# Patient Record
Sex: Male | Born: 1995
Health system: Southern US, Community
[De-identification: ages and names within clinical notes are randomized; demographics above are authoritative.]

## PROBLEM LIST (undated history)

## (undated) DIAGNOSIS — J45909 Unspecified asthma, uncomplicated: Secondary | ICD-10-CM

## (undated) HISTORY — PX: TYMPANOSTOMY TUBE PLACEMENT: SHX32

## (undated) HISTORY — PX: TONSILLECTOMY: SUR1361

---
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
## 2017-05-24 IMAGING — CT CT ABD-PELV W/ CM
2 of 4 series · 16 of 46 positions shown, 18 images · IV contrast (APPLIED)
Comparison: None.

CLINICAL DATA: Nausea, vomiting, diarrhea. Chronic abdominal pain.
Worsening symptoms today.

EXAM:
CT ABDOMEN AND PELVIS WITH CONTRAST
TECHNIQUE: Multidetector CT imaging of the abdomen and pelvis was performed
using the standard protocol following bolus administration of
intravenous contrast.
CONTRAST:  100mL OMNIPAQUE IOHEXOL 300 MG/ML  SOLN

[Series 2: abd/ pelvis 5.0 i30f 1 · axial · 0.69mm/px · z∈[-715,-300]mm · 13 of 91 slices shown, 15 images]
[im 4/91  soft-tissue]
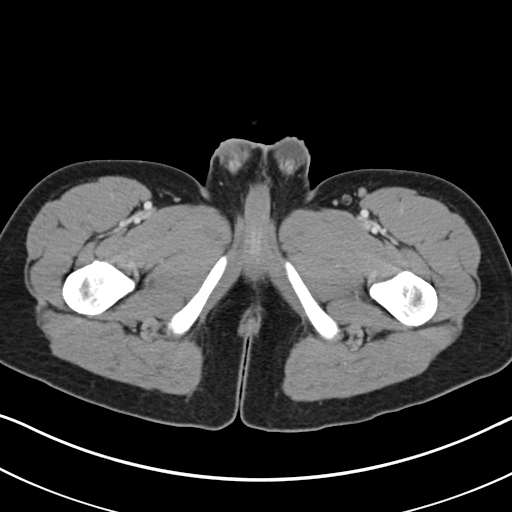
[im 4/91  bone]
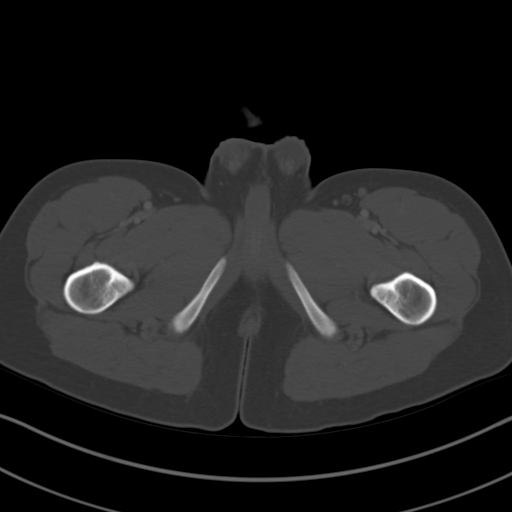
[im 12/91  soft-tissue]
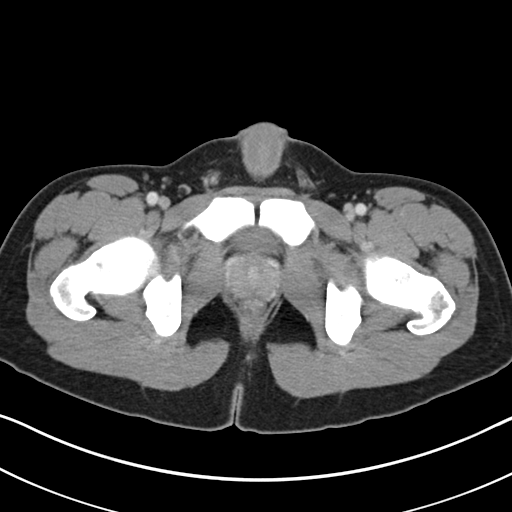
[im 19/91  soft-tissue]
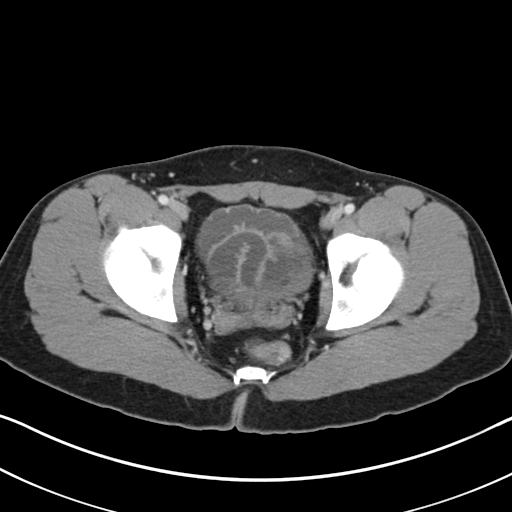
[im 27/91  soft-tissue]
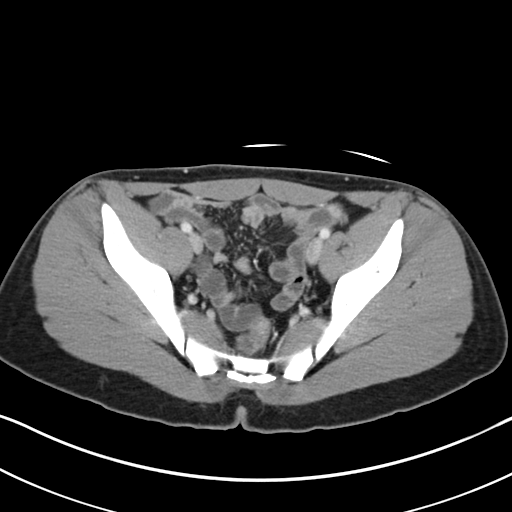
[im 31/91  soft-tissue]
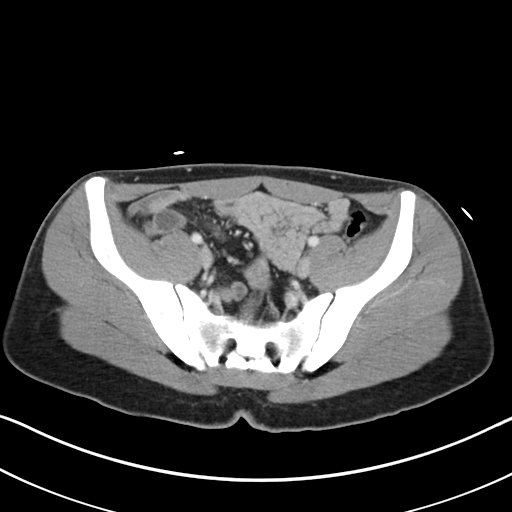
[im 38/91  soft-tissue]
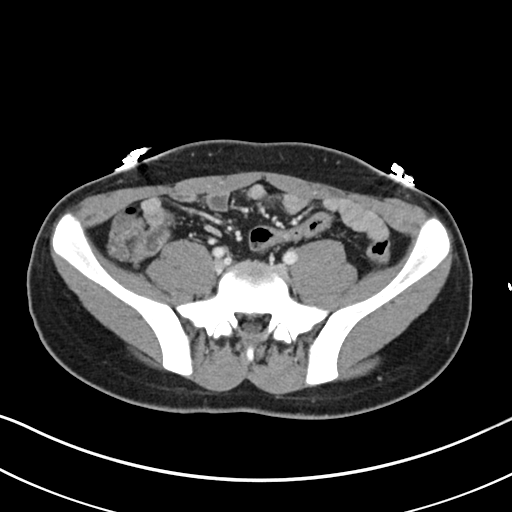
[im 46/91  soft-tissue]
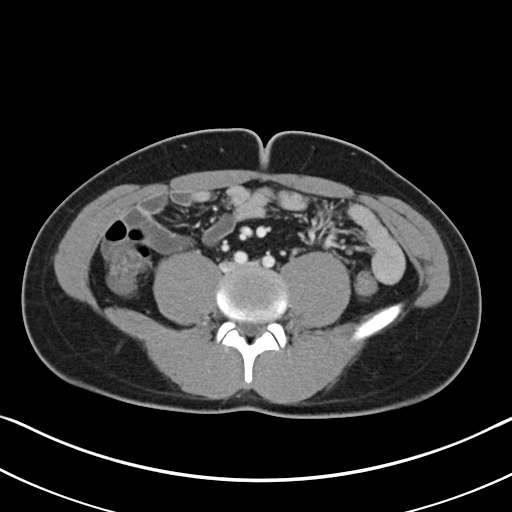
[im 53/91  soft-tissue]
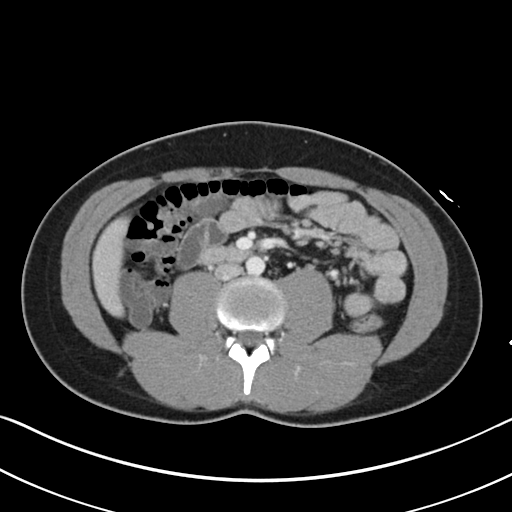
[im 61/91  soft-tissue]
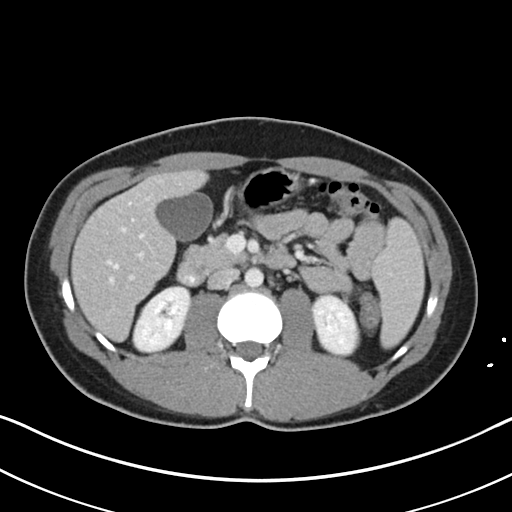
[im 61/91  bone]
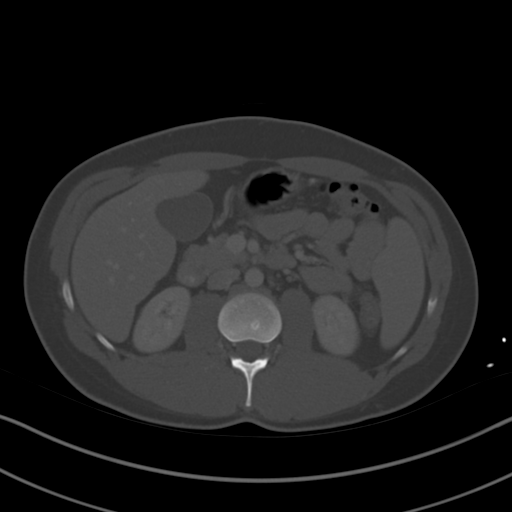
[im 64/91  soft-tissue]
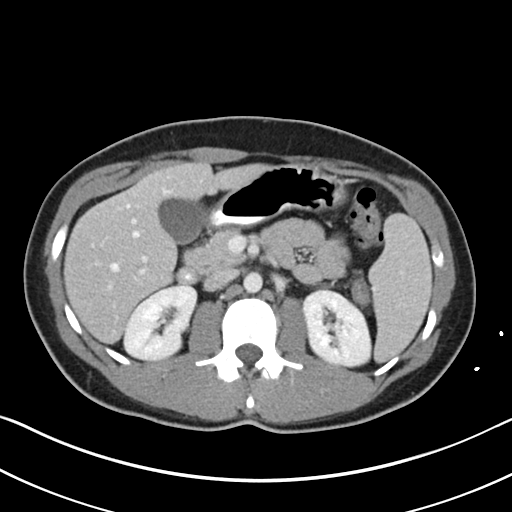
[im 72/91  soft-tissue]
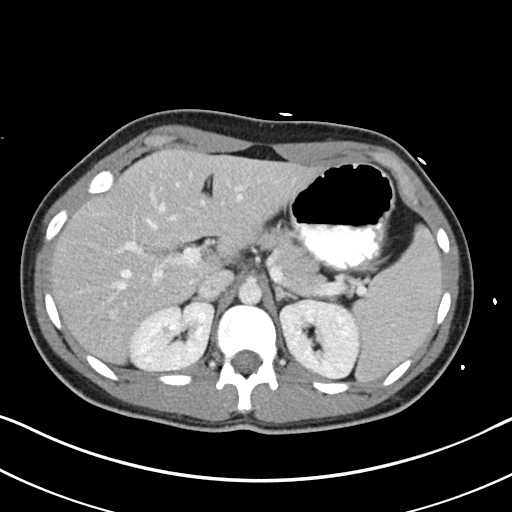
[im 79/91  soft-tissue]
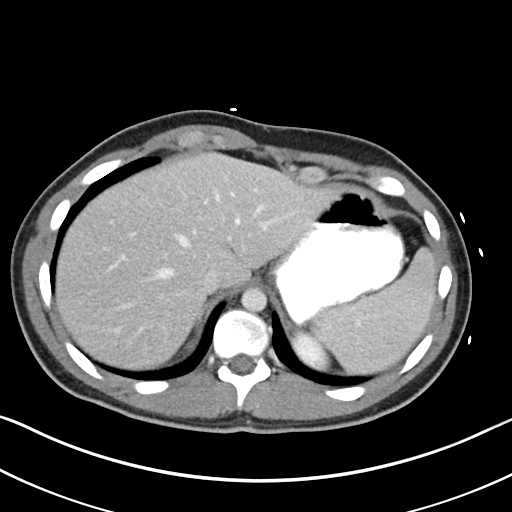
[im 87/91  soft-tissue]
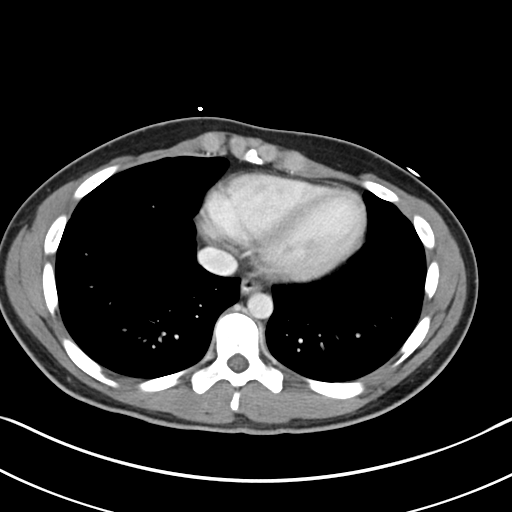

[Series 5: coronal soft tissue · coronal · 0.75mm/px · 3 of 73 slices shown]
[im 25/73  soft-tissue]
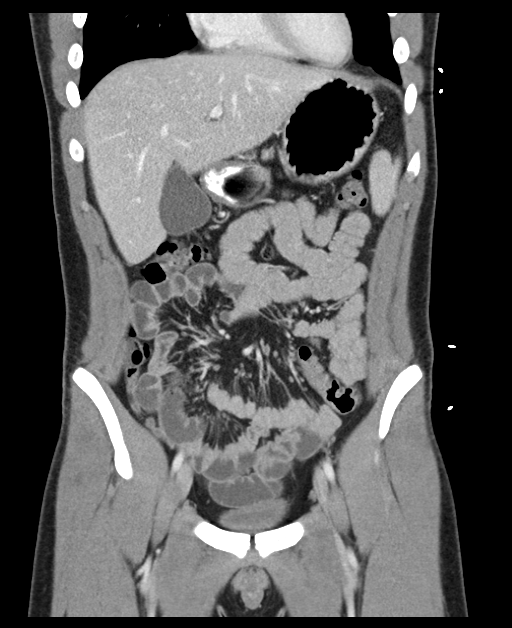
[im 33/73  soft-tissue]
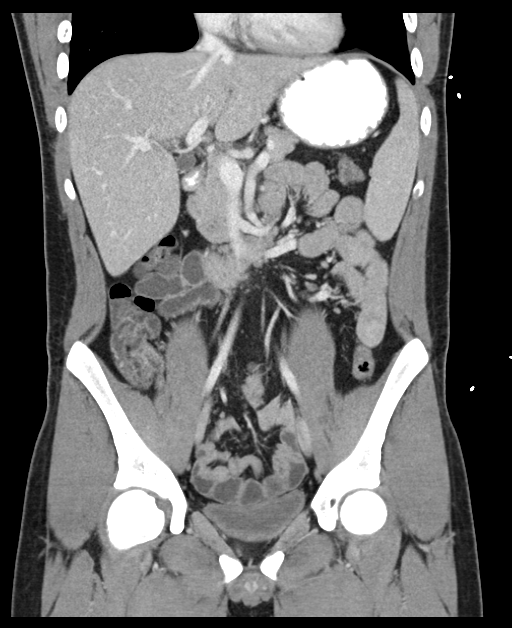
[im 41/73  soft-tissue]
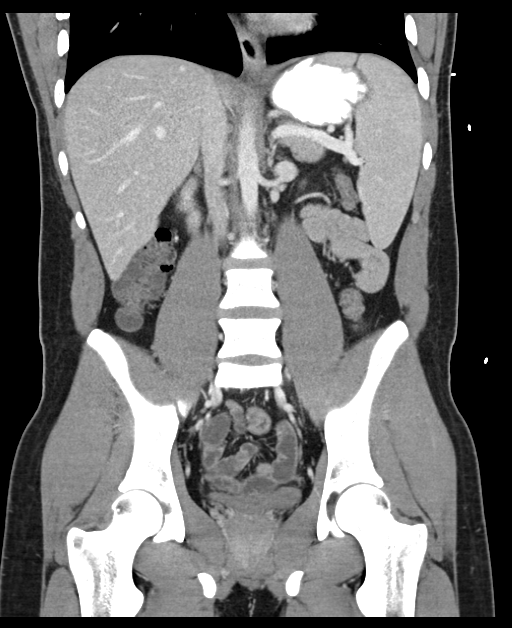

[16 of 46 positions shown; findings below may reference images not displayed]

FINDINGS: Musculoskeletal:  Normal.

Lung Bases: Clear.

Liver:  Normal.

Spleen: Splenomegaly is present. Splenic volume is 840 mL. Splenic
dimensions are 12 cm x 14 cm x 10 cm.

Gallbladder:  Normal.

Common bile duct:  Normal.

Pancreas:  Normal.

Adrenal glands:  Normal.

Kidneys: Normal enhancement. No calculi. No hydronephrosis. LEFT
ureter normal. RIGHT ureter normal.

Stomach:  Normal.

Small bowel: No small bowel obstruction. Portions of small bowel are
collapsed.

Colon: Normal appendix. Nonspecific fluid levels are present within
the colon. These are present in the proximal colon and in the
rectosigmoid.

Pelvic Genitourinary:  Normal.  Urinary bladder collapsed.

Peritoneum: No free air.  No free fluid.

Vascular/lymphatic: Normal.

Body Wall: Normal.
IMPRESSION: 1. Nonspecific colonic air-fluid levels, most commonly associated
with enteric infection.
2. Splenomegaly.

## 2019-07-18 ENCOUNTER — Other Ambulatory Visit: Payer: Self-pay

## 2019-07-18 ENCOUNTER — Ambulatory Visit: Payer: Self-pay

## 2019-07-18 DIAGNOSIS — Z0289 Encounter for other administrative examinations: Secondary | ICD-10-CM

## 2019-07-18 LAB — POCT URINALYSIS DIPSTICK
Bilirubin, UA: NEGATIVE
Blood, UA: NEGATIVE
Glucose, UA: NEGATIVE
Ketones, UA: NEGATIVE
Leukocytes, UA: NEGATIVE
Nitrite, UA: NEGATIVE
Protein, UA: POSITIVE — AB
Spec Grav, UA: >=1.03 — AB (ref 1.010–1.025)
Urobilinogen, UA: 0.2 E.U./dL
pH, UA: 6 (ref 5.0–8.0)

## 2019-07-19 LAB — CMP12+LP+TP+TSH+6AC+CBC/D/PLT
ALT: 31 IU/L (ref 0–44)
AST: 19 IU/L (ref 0–40)
Albumin/Globulin Ratio: 2.1 (ref 1.2–2.2)
Albumin: 4.6 g/dL (ref 4.1–5.2)
Alkaline Phosphatase: 46 IU/L (ref 39–117)
BUN/Creatinine Ratio: 21 — ABNORMAL HIGH (ref 9–20)
BUN: 19 mg/dL (ref 6–20)
Basophils Absolute: 0 10*3/uL (ref 0.0–0.2)
Basos: 0 %
Bilirubin Total: 0.6 mg/dL (ref 0.0–1.2)
Calcium: 9.5 mg/dL (ref 8.7–10.2)
Chloride: 106 mmol/L (ref 96–106)
Chol/HDL Ratio: 5.2 ratio — ABNORMAL HIGH (ref 0.0–5.0)
Cholesterol, Total: 198 mg/dL (ref 100–199)
Creatinine, Ser: 0.91 mg/dL (ref 0.76–1.27)
EOS (ABSOLUTE): 0.1 10*3/uL (ref 0.0–0.4)
Eos: 2 %
Estimated CHD Risk: 1.1 times avg. — ABNORMAL HIGH (ref 0.0–1.0)
Free Thyroxine Index: 2.4 (ref 1.2–4.9)
GFR calc Af Amer: 139 mL/min/{1.73_m2} (ref >59)
GFR calc non Af Amer: 120 mL/min/{1.73_m2} (ref >59)
GGT: 21 IU/L (ref 0–65)
Globulin, Total: 2.2 g/dL (ref 1.5–4.5)
Glucose: 90 mg/dL (ref 65–99)
HDL: 38 mg/dL — ABNORMAL LOW (ref >39)
Hematocrit: 44.8 % (ref 37.5–51.0)
Hemoglobin: 15.2 g/dL (ref 13.0–17.7)
Immature Grans (Abs): 0 10*3/uL (ref 0.0–0.1)
Immature Granulocytes: 0 %
Iron: 106 ug/dL (ref 38–169)
LDH: 140 IU/L (ref 121–224)
LDL Chol Calc (NIH): 138 mg/dL — ABNORMAL HIGH (ref 0–99)
Lymphocytes Absolute: 2 10*3/uL (ref 0.7–3.1)
Lymphs: 37 %
MCH: 29.7 pg (ref 26.6–33.0)
MCHC: 33.9 g/dL (ref 31.5–35.7)
MCV: 88 fL (ref 79–97)
Monocytes Absolute: 0.4 10*3/uL (ref 0.1–0.9)
Monocytes: 8 %
Neutrophils Absolute: 2.8 10*3/uL (ref 1.4–7.0)
Neutrophils: 53 %
Phosphorus: 3.7 mg/dL (ref 2.8–4.1)
Platelets: 254 10*3/uL (ref 150–450)
Potassium: 4.5 mmol/L (ref 3.5–5.2)
RBC: 5.11 x10E6/uL (ref 4.14–5.80)
RDW: 11.9 % (ref 11.6–15.4)
Sodium: 140 mmol/L (ref 134–144)
T3 Uptake Ratio: 28 % (ref 24–39)
T4, Total: 8.5 ug/dL (ref 4.5–12.0)
TSH: 1.26 u[IU]/mL (ref 0.450–4.500)
Total Protein: 6.8 g/dL (ref 6.0–8.5)
Triglycerides: 121 mg/dL (ref 0–149)
Uric Acid: 7.2 mg/dL (ref 3.7–8.6)
VLDL Cholesterol Cal: 22 mg/dL (ref 5–40)
WBC: 5.3 10*3/uL (ref 3.4–10.8)

## 2019-08-29 ENCOUNTER — Other Ambulatory Visit: Payer: Self-pay

## 2019-08-29 ENCOUNTER — Ambulatory Visit: Payer: 59 | Admitting: Occupational Medicine

## 2019-08-29 ENCOUNTER — Encounter: Payer: Self-pay | Admitting: Occupational Medicine

## 2019-08-29 VITALS — BP 132/82 | HR 101 | Temp 98.6°F | Resp 12 | Ht 67.0 in | Wt 215.0 lb

## 2019-08-29 DIAGNOSIS — Z0000 Encounter for general adult medical examination without abnormal findings: Secondary | ICD-10-CM

## 2019-11-01 DIAGNOSIS — Z03818 Encounter for observation for suspected exposure to other biological agents ruled out: Secondary | ICD-10-CM | POA: Diagnosis not present

## 2019-11-01 DIAGNOSIS — J069 Acute upper respiratory infection, unspecified: Secondary | ICD-10-CM | POA: Diagnosis not present

## 2019-11-01 DIAGNOSIS — J029 Acute pharyngitis, unspecified: Secondary | ICD-10-CM | POA: Diagnosis not present

## 2020-01-01 ENCOUNTER — Other Ambulatory Visit: Payer: Self-pay

## 2020-01-01 ENCOUNTER — Ambulatory Visit: Payer: Self-pay

## 2020-01-01 DIAGNOSIS — Z0000 Encounter for general adult medical examination without abnormal findings: Secondary | ICD-10-CM

## 2020-01-01 LAB — POCT URINALYSIS DIPSTICK
Bilirubin, UA: NEGATIVE
Blood, UA: NEGATIVE
Glucose, UA: NEGATIVE
Ketones, UA: NEGATIVE
Leukocytes, UA: NEGATIVE
Nitrite, UA: NEGATIVE
Protein, UA: NEGATIVE
Spec Grav, UA: >=1.03 — AB (ref 1.010–1.025)
Urobilinogen, UA: 0.2 E.U./dL
pH, UA: 5.5 (ref 5.0–8.0)

## 2020-01-01 NOTE — Progress Notes (Signed)
Scheduled to complete physical 01/10/20 with Albina Billet, NP-C (Interim Provider).  AMD

## 2020-01-04 LAB — CMP12+LP+TP+TSH+6AC+CBC/D/PLT
ALT: 27 IU/L (ref 0–44)
AST: 18 IU/L (ref 0–40)
Albumin/Globulin Ratio: 2.6 — ABNORMAL HIGH (ref 1.2–2.2)
Albumin: 4.9 g/dL (ref 4.1–5.2)
Alkaline Phosphatase: 49 IU/L (ref 39–117)
BUN/Creatinine Ratio: 16 (ref 9–20)
BUN: 15 mg/dL (ref 6–20)
Basophils Absolute: 0 10*3/uL (ref 0.0–0.2)
Basos: 1 %
Bilirubin Total: 0.4 mg/dL (ref 0.0–1.2)
Calcium: 9.7 mg/dL (ref 8.7–10.2)
Chloride: 104 mmol/L (ref 96–106)
Chol/HDL Ratio: 5.5 ratio — ABNORMAL HIGH (ref 0.0–5.0)
Cholesterol, Total: 181 mg/dL (ref 100–199)
Creatinine, Ser: 0.91 mg/dL (ref 0.76–1.27)
EOS (ABSOLUTE): 0.1 10*3/uL (ref 0.0–0.4)
Eos: 2 %
Estimated CHD Risk: 1.2 times avg. — ABNORMAL HIGH (ref 0.0–1.0)
Free Thyroxine Index: 2.5 (ref 1.2–4.9)
GFR calc Af Amer: 138 mL/min/{1.73_m2} (ref >59)
GFR calc non Af Amer: 119 mL/min/{1.73_m2} (ref >59)
GGT: 19 IU/L (ref 0–65)
Globulin, Total: 1.9 g/dL (ref 1.5–4.5)
Glucose: 95 mg/dL (ref 65–99)
HDL: 33 mg/dL — ABNORMAL LOW (ref >39)
Hematocrit: 42.5 % (ref 37.5–51.0)
Hemoglobin: 14.9 g/dL (ref 13.0–17.7)
Immature Grans (Abs): 0 10*3/uL (ref 0.0–0.1)
Immature Granulocytes: 0 %
Iron: 66 ug/dL (ref 38–169)
LDH: 140 IU/L (ref 121–224)
LDL Chol Calc (NIH): 125 mg/dL — ABNORMAL HIGH (ref 0–99)
Lymphocytes Absolute: 1.9 10*3/uL (ref 0.7–3.1)
Lymphs: 40 %
MCH: 31.6 pg (ref 26.6–33.0)
MCHC: 35.1 g/dL (ref 31.5–35.7)
MCV: 90 fL (ref 79–97)
Monocytes Absolute: 0.4 10*3/uL (ref 0.1–0.9)
Monocytes: 9 %
Neutrophils Absolute: 2.3 10*3/uL (ref 1.4–7.0)
Neutrophils: 48 %
Phosphorus: 3.3 mg/dL (ref 2.8–4.1)
Platelets: 238 10*3/uL (ref 150–450)
Potassium: 4.5 mmol/L (ref 3.5–5.2)
RBC: 4.71 x10E6/uL (ref 4.14–5.80)
RDW: 12.2 % (ref 11.6–15.4)
Sodium: 140 mmol/L (ref 134–144)
T3 Uptake Ratio: 31 % (ref 24–39)
T4, Total: 8 ug/dL (ref 4.5–12.0)
TSH: 0.427 u[IU]/mL — ABNORMAL LOW (ref 0.450–4.500)
Total Protein: 6.8 g/dL (ref 6.0–8.5)
Triglycerides: 125 mg/dL (ref 0–149)
Uric Acid: 7.4 mg/dL (ref 3.8–8.4)
VLDL Cholesterol Cal: 23 mg/dL (ref 5–40)
WBC: 4.7 10*3/uL (ref 3.4–10.8)

## 2020-01-04 LAB — QUANTIFERON-TB GOLD PLUS
QuantiFERON Mitogen Value: >10 IU/mL
QuantiFERON Nil Value: 0.03 IU/mL
QuantiFERON TB1 Ag Value: 0.07 IU/mL
QuantiFERON TB2 Ag Value: 0.08 IU/mL
QuantiFERON-TB Gold Plus: NEGATIVE

## 2020-01-12 ENCOUNTER — Telehealth: Payer: Self-pay

## 2020-01-12 NOTE — Telephone Encounter (Signed)
No vm set up was unable to lmom to confirm for 01-16-20 ov.

## 2020-01-16 ENCOUNTER — Ambulatory Visit: Payer: 59 | Admitting: Nurse Practitioner

## 2020-01-16 ENCOUNTER — Encounter: Payer: Self-pay | Admitting: Emergency Medicine

## 2020-01-16 ENCOUNTER — Other Ambulatory Visit: Payer: Self-pay

## 2020-01-16 ENCOUNTER — Encounter: Payer: Self-pay | Admitting: Nurse Practitioner

## 2020-01-16 ENCOUNTER — Ambulatory Visit: Payer: Self-pay | Admitting: Emergency Medicine

## 2020-01-16 VITALS — BP 134/84 | HR 94 | Temp 98.1°F | Resp 12 | Ht 67.0 in | Wt 207.0 lb

## 2020-01-16 VITALS — BP 128/90 | HR 77 | Temp 97.6°F | Resp 16 | Ht 67.0 in | Wt 208.0 lb

## 2020-01-16 DIAGNOSIS — K529 Noninfective gastroenteritis and colitis, unspecified: Secondary | ICD-10-CM | POA: Diagnosis not present

## 2020-01-16 DIAGNOSIS — K580 Irritable bowel syndrome with diarrhea: Secondary | ICD-10-CM | POA: Diagnosis not present

## 2020-01-16 DIAGNOSIS — K2980 Duodenitis without bleeding: Secondary | ICD-10-CM

## 2020-01-16 DIAGNOSIS — Z0000 Encounter for general adult medical examination without abnormal findings: Secondary | ICD-10-CM

## 2020-01-16 MED ORDER — DICYCLOMINE HCL 10 MG PO CAPS: 10.0000 mg | ORAL_CAPSULE | Freq: Three times a day (TID) | ORAL | 3 refills | Status: DC | PRN

## 2020-01-16 NOTE — Progress Notes (Signed)
Labs completed 01/01/20. EKG completed today.  AMD

## 2020-01-16 NOTE — Progress Notes (Signed)
[facility] [address] [place], [place] [address]  Internal MEDICINE  Office Visit Note  Patient Name: Jack Garza  [id number]  [id number]  Date of Service: 01/16/2020   Complaints/HPI Pt is here for establishment of PCP. Chief Complaint  Patient presents with  . New Patient (Initial Visit)   The patient is here to reestablish primary care provider. He has recently restarted having nausea and vomiting about a month ago. He consistently has abdominal cramping with diarrhea. This has been, pretty much, ongoing since he was a patient here severl years ago. He does have significant food allergies. He saw GI specialist. Had a colonoscopy and endoscopy done. He did have gastritis and enteritis due to chronic inflammation of the stomach and intestinal organs. He really would like to have a new referral to see new GI provider. His mother prefers for him to see Dr. Tobi Bastos to verify food allergies and possible celiac disease causing the problems or if there is something else going on. He states that nausea and vomiting occurs every few weeks. The intestinal cramping and diarrhea are nearly every day.  Will take antidiarrheal medication sometimes which helps for short period.    Current Medication: Outpatient Encounter Medications as of 01/16/2020  Medication Sig  . dicyclomine (BENTYL) 10 MG capsule Take 1 capsule (10 mg total) by mouth 3 (three) times daily as needed for spasms.   No facility-administered encounter medications on file as of 01/16/2020.    Surgical History: Past Surgical History:  Procedure Laterality Date  . COLONOSCOPY  2016  . TONSILLECTOMY    . TYMPANOSTOMY TUBE PLACEMENT      Medical History: Past Medical History:  Diagnosis Date  . Asthma     Family History: Family History  Problem Relation Age of Onset  . Hypertension Mother   . Hypertension Father   . Diabetes Father     Social History   Socioeconomic History  . Marital  status: Single    Spouse name: Not on file  . Number of children: Not on file  . Years of education: Not on file  . Highest education level: Not on file  Occupational History  . Not on file  Tobacco Use  . Smoking status: Never Smoker  . Smokeless tobacco: Never Used  Substance and Sexual Activity  . Alcohol use: Yes    Comment: ocassionally   . Drug use: No  . Sexual activity: Not on file  Other Topics Concern  . Not on file  Social History Narrative  . Not on file   Social Determinants of Health   Financial Resource Strain:   . Difficulty of Paying Living Expenses:   Food Insecurity:   . Worried About Programme researcher, broadcasting/film/video in the Last Year:   . Barista in the Last Year:   Transportation Needs:   . Freight forwarder (Medical):   Marland Kitchen Lack of Transportation (Non-Medical):   Physical Activity:   . Days of Exercise per Week:   . Minutes of Exercise per Session:   Stress:   . Feeling of Stress :   Social Connections:   . Frequency of Communication with Friends and Family:   . Frequency of Social Gatherings with Friends and Family:   . Attends Religious Services:   . Active Member of Clubs or Organizations:   . Attends Banker Meetings:   Marland Kitchen Marital Status:   Intimate Partner Violence:   . Fear of Current or  Ex-Partner:   . Emotionally Abused:   Marland Kitchen Physically Abused:   . Sexually Abused:      Review of Systems  Constitutional: Negative for activity change, chills, fatigue and unexpected weight change.  HENT: Negative for congestion, postnasal drip, rhinorrhea, sneezing and sore throat.   Respiratory: Negative for cough, chest tightness, shortness of breath and wheezing.   Cardiovascular: Negative for chest pain and palpitations.  Gastrointestinal: Positive for diarrhea and nausea. Negative for abdominal pain, constipation and vomiting.       Intestinal cramping.   Endocrine: Negative for cold intolerance, heat intolerance, polydipsia and  polyuria.  Musculoskeletal: Negative for arthralgias, back pain, joint swelling and neck pain.  Skin: Negative for rash.  Allergic/Immunologic: Negative for environmental allergies.  Neurological: Negative for dizziness, tremors, numbness and headaches.  Hematological: Negative for adenopathy. Does not bruise/bleed easily.  Psychiatric/Behavioral: Negative for behavioral problems (Depression), sleep disturbance and suicidal ideas. The patient is not nervous/anxious.    Today's Vitals   01/16/20 0857  BP: 128/90  Pulse: 77  Resp: 16  Temp: 97.6 F (36.4 C)  SpO2: 98%  Weight: 208 lb (94.3 kg)  Height: 5\' 7"  (1.702 m)   Body mass index is 32.58 kg/m.  Physical Exam Vitals and nursing note reviewed.  Constitutional:      General: He is not in acute distress.    Appearance: Normal appearance. He is well-developed. He is not diaphoretic.  HENT:     Head: Normocephalic and atraumatic.     Mouth/Throat:     Pharynx: No oropharyngeal exudate.  Eyes:     Pupils: Pupils are equal, round, and reactive to light.  Neck:     Thyroid: No thyromegaly.     Vascular: No JVD.     Trachea: No tracheal deviation.  Cardiovascular:     Rate and Rhythm: Normal rate and regular rhythm.     Heart sounds: Normal heart sounds. No murmur. No friction rub. No gallop.   Pulmonary:     Effort: Pulmonary effort is normal. No respiratory distress.     Breath sounds: Normal breath sounds. No wheezing or rales.  Chest:     Chest wall: No tenderness.  Abdominal:     General: Bowel sounds are normal.     Palpations: Abdomen is soft.     Comments: Mild, generalized abdominal tenderness   Musculoskeletal:        General: Normal range of motion.     Cervical back: Normal range of motion and neck supple.  Lymphadenopathy:     Cervical: No cervical adenopathy.  Skin:    General: Skin is warm and dry.  Neurological:     Mental Status: He is alert and oriented to person, place, and time.     Cranial  Nerves: No cranial nerve deficit.  Psychiatric:        Behavior: Behavior normal.        Thought Content: Thought content normal.        Judgment: Judgment normal.    Assessment/Plan: 1. Irritable bowel syndrome with diarrhea Start dicyclomine 10mg  up to three times daily as needed for intestinal cramping and diarrhea. New referral to GI for further evaluation.  - dicyclomine (BENTYL) 10 MG capsule; Take 1 capsule (10 mg total) by mouth 3 (three) times daily as needed for spasms.  Dispense: 90 capsule; Refill: 3 - Ambulatory referral to Gastroenterology  2. Chronic diarrhea Start dicyclomine 10mg  up to three times daily as needed for intestinal cramping and diarrhea.  New referral to GI for further evaluation.  - Ambulatory referral to Gastroenterology  3. Duodenitis New referral to GI for further evaluation.  - Ambulatory referral to Gastroenterology  General Counseling: Andee Lineman understanding of the findings of todays visit and agrees with plan of treatment. I have discussed any further diagnostic evaluation that may be needed or ordered today. We also reviewed his medications today. he has been encouraged to call the office with any questions or concerns that should arise related to todays visit.    Counseling:  This patient was seen by Vincent Gros FNP Collaboration with Dr Lyndon Code as a part of collaborative care agreement  Orders Placed This Encounter  Procedures  . Ambulatory referral to Gastroenterology    Meds ordered this encounter  Medications  . dicyclomine (BENTYL) 10 MG capsule    Sig: Take 1 capsule (10 mg total) by mouth 3 (three) times daily as needed for spasms.    Dispense:  90 capsule    Refill:  3    Order Specific Question:   Supervising Provider    Answer:   Lyndon Code [[id number]]    Time spent: 30 Minutes

## 2020-01-16 NOTE — Progress Notes (Signed)
    I have reviewed the triage vital signs and the nursing notes.   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 male is here for annual physical.  He denies any new health problems since his last physical.      Past Medical History:  Diagnosis Date  . Asthma     Patient Active Problem List   Diagnosis Date Noted  . Irritable bowel syndrome with diarrhea 01/16/2020  . Examination, physical, employee 07/18/2019  . Encounter for physical examination related to employment 07/18/2019  . Chronic diarrhea 08/18/2015  . Duodenitis 08/18/2015  . Diarrhea 07/09/2015  . Right upper quadrant abdominal pain 07/09/2015  . Non-intractable vomiting with nausea 07/09/2015  . Sprain of rhomboid 11/07/2013    Past Surgical History:  Procedure Laterality Date  . COLONOSCOPY  2016  . TONSILLECTOMY    . TYMPANOSTOMY TUBE PLACEMENT      Prior to Admission medications   Medication Sig Start Date End Date Taking? Authorizing Provider  dicyclomine (BENTYL) 10 MG capsule Take 1 capsule (10 mg total) by mouth 3 (three) times daily as needed for spasms. 01/16/20  Yes Boscia, Kathlynn Grate, NP    Allergies Gluten meal, Peanuts [peanut oil], Soy allergy, and Wheat bran  Family History  Problem Relation Age of Onset  . Hypertension Mother   . Hypertension Father   . Diabetes Father     Social History Social History   Tobacco Use  . Smoking status: Never Smoker  . Smokeless tobacco: Never Used  Substance Use Topics  . Alcohol use: Yes    Comment: ocassionally   . Drug use: No    Review of Systems Constitutional: No fever/chills Eyes: No visual changes. ENT: No complaints. Cardiovascular: Denies chest pain. Respiratory: Denies shortness of breath. Gastrointestinal: No abdominal pain.  No nausea, no vomiting.   Genitourinary: Negative for dysuria. Musculoskeletal: Negative for back pain or muscle aches. Skin: Negative  for rash. Neurological: Negative for headaches, focal weakness or numbness. ____________________________________________   PHYSICAL EXAM: Constitutional: Alert and oriented. Well appearing and in no acute distress. Eyes: Conjunctivae are normal. PERRL. EOMI. Head: Atraumatic. Neck: No stridor.  No tenderness on palpation of posterior cervical spine. Cardiovascular: Normal rate, regular rhythm. Grossly normal heart sounds.  Good peripheral circulation. Respiratory: Normal respiratory effort.  No retractions. Lungs CTAB. Gastrointestinal: Soft and nontender. No distention.  Bowel sounds are normoactive x4 quadrants. Musculoskeletal: Nontender thoracic or lumbar spine.  Patient is able move upper and lower extremities without any difficulty.  Good muscle strength bilaterally.  Normal gait was noted. Neurologic:  Normal speech and language. No gross focal neurologic deficits are appreciated. No gait instability. Skin:  Skin is warm, dry and intact. No rash noted. Psychiatric: Mood and affect are normal. Speech and behavior are normal.  ____________________________________________   LABS  Discussed with patient. ____________________________________________  EKG Sinus rhythm with a ventricular rate of 86.  __________________________________________   FINAL CLINICAL IMPRESSION(S)   24 year old male presents to the clinic for an annual physical.  Labs were discussed.  Unremarkable physical exam.   ED Discharge Orders         Ordered    EKG 12-Lead     01/16/20 1107           Note:  This document was prepared using Dragon voice recognition software and may include unintentional dictation errors.

## 2020-03-06 ENCOUNTER — Other Ambulatory Visit: Payer: Self-pay

## 2020-03-06 ENCOUNTER — Ambulatory Visit (INDEPENDENT_AMBULATORY_CARE_PROVIDER_SITE_OTHER): Payer: 59 | Admitting: Gastroenterology

## 2020-03-06 VITALS — BP 123/85 | HR 85 | Temp 98.1°F | Ht 67.0 in | Wt 210.0 lb

## 2020-03-06 DIAGNOSIS — R161 Splenomegaly, not elsewhere classified: Secondary | ICD-10-CM | POA: Diagnosis not present

## 2020-03-06 DIAGNOSIS — K580 Irritable bowel syndrome with diarrhea: Secondary | ICD-10-CM | POA: Diagnosis not present

## 2020-03-06 DIAGNOSIS — R197 Diarrhea, unspecified: Secondary | ICD-10-CM | POA: Diagnosis not present

## 2020-03-06 MED ORDER — DICYCLOMINE HCL 10 MG PO CAPS: 10.0000 mg | ORAL_CAPSULE | Freq: Three times a day (TID) | ORAL | 1 refills | Status: DC

## 2020-03-06 NOTE — Progress Notes (Signed)
Jack Bellows MD, MRCP(U.K) [address]  [place]  [place], [place] [address]  Main: [phone]  Fax: [phone]   Gastroenterology Consultation  Referring Provider:     [name], NP Primary Care Physician:  Jack Freshwater, NP Primary Gastroenterologist:  Dr. Jonathon Garza  Reason for Consultation:   Irritable bowel syndrome        HPI:   Jack Garza is a 24 y.o. y/o male referred for consultation & management  by  Jack Freshwater, NP.    Prior GI notes reviewed and summarized Was recently seen by his primary care provider.  Complaining of abdominal cramping with diarrhea. Previously has been seen by pediatric gastroenterology in 2016 at [facility] I reviewed the results and shows duodenal biopsies as well as esophageal biopsy, terminal ileal biopsy biopsies of the right side of the colon left side and transverse colon.  At that time he also had splenomegaly and duodenitis.  Did not have follow-up for the splenomegaly either  He states that he has had longstanding diarrhea.  Up to 4 bowel movements a day.  Watery nonbloody.  Preceded with some abdominal cramping.  Certain foods make it worse.  He drinks a few cups of sweet tea daily.  No other artificial sugars in his diet.  No family history of IBD or colon cancer.  Past Medical History:  Diagnosis Date   Asthma     Past Surgical History:  Procedure Laterality Date   COLONOSCOPY  2016   TONSILLECTOMY     TYMPANOSTOMY TUBE PLACEMENT      Prior to Admission medications   Medication Sig Start Date End Date Taking? Authorizing Provider  dicyclomine (BENTYL) 10 MG capsule Take 1 capsule (10 mg total) by mouth 3 (three) times daily as needed for spasms. 01/16/20   Jack Freshwater, NP    Family History  Problem Relation Age of Onset   Hypertension Mother    Hypertension Father    Diabetes Father      Social History   Tobacco Use   Smoking status: Never Smoker   Smokeless  tobacco: Never Used  Substance Use Topics   Alcohol use: Yes    Comment: ocassionally    Drug use: No    Allergies as of 03/06/2020 - Review Complete 01/16/2020  Allergen Reaction Noted   Gluten meal Nausea And Vomiting 06/25/2015   Peanuts [peanut oil]  06/25/2015   Soy allergy Nausea And Vomiting 06/25/2015   Wheat bran Diarrhea 06/26/2015    Review of Systems:    All systems reviewed and negative except where noted in HPI.   Physical Exam:  There were no vitals taken for this visit. No LMP for male patient. Psych:  Alert and cooperative. Normal Garza and affect. General:   Alert,  Well-developed, well-nourished, pleasant and cooperative in NAD Head:  Normocephalic and atraumatic. Eyes:  Sclera clear, no icterus.   Conjunctiva pink. Ears:  Normal auditory acuity. Lungs:  Respirations even and unlabored.  Clear throughout to auscultation.   No wheezes, crackles, or rhonchi. No acute distress. Heart:  Regular rate and rhythm; no murmurs, clicks, rubs, or gallops. Abdomen:  Normal bowel sounds.  No bruits.  Soft, non-tender and non-distended without masses, hepatosplenomegaly or hernias noted.  No guarding or rebound tenderness.    Neurologic:  Alert and oriented x3;  grossly normal neurologically. Psych:  Alert and cooperative. Normal Garza and affect.  Imaging Studies: No results found.  Assessment and  Plan:   Jack Garza is a 24 y.o. y/o male has been referred for diarrhea.  Previously evaluated at [facility] by the pediatric gastroenterologist and from the limited records available I could tell that EGD and colonoscopy were negative.  Incidentally found splenomegaly in 2016.  Plan 1.  Celiac serology, food allergy profile, H. pylori breath test 2.  Ultrasound of the abdomen to evaluate for splenomegaly that was seen in 2016 and no subsequent follow-up. 3.  Fecal calprotectin, stool GI PCR and C. Difficile 4.  If above does not help then will need  empirical treatment for IBS-D with Xifaxan at his next visit. 5.  Trial of low FODMAP diet 6.  Trial of Bentyl for abdominal cramping  Follow up in 2 weeks telephone visit  Dr Jack Mood MD,MRCP(U.K)

## 2020-03-08 LAB — CELIAC DISEASE AB SCREEN W/RFX
Antigliadin Abs, IgA: 4 units (ref 0–19)
IgA/Immunoglobulin A, Serum: 92 mg/dL (ref 90–386)
Transglutaminase IgA: <2 U/mL (ref 0–3)

## 2020-03-08 LAB — H. PYLORI BREATH TEST: H pylori Breath Test: NEGATIVE

## 2020-03-10 LAB — FOOD ALLERGY PROFILE
Allergen Corn, IgE: 0.29 kU/L — AB
Clam IgE: <0.1 kU/L
Codfish IgE: <0.1 kU/L
Egg White IgE: <0.1 kU/L
Milk IgE: <0.1 kU/L
Peanut IgE: 0.5 kU/L — AB
Scallop IgE: 0.16 kU/L — AB
Sesame Seed IgE: 0.49 kU/L — AB
Shrimp IgE: <0.1 kU/L
Soybean IgE: 0.28 kU/L — AB
Walnut IgE: 0.28 kU/L — AB
Wheat IgE: 0.54 kU/L — AB

## 2020-03-12 LAB — SPECIMEN STATUS REPORT

## 2020-03-13 ENCOUNTER — Ambulatory Visit
Admission: RE | Admit: 2020-03-13 | Discharge: 2020-03-13 | Disposition: A | Discharge status: Home / Self Care | Payer: 59 | Source: Ambulatory Visit | Attending: Gastroenterology | Admitting: Gastroenterology

## 2020-03-13 ENCOUNTER — Other Ambulatory Visit: Payer: Self-pay

## 2020-03-13 ENCOUNTER — Telehealth: Payer: Self-pay

## 2020-03-13 DIAGNOSIS — K580 Irritable bowel syndrome with diarrhea: Secondary | ICD-10-CM

## 2020-03-13 DIAGNOSIS — R197 Diarrhea, unspecified: Secondary | ICD-10-CM

## 2020-03-13 DIAGNOSIS — R161 Splenomegaly, not elsewhere classified: Secondary | ICD-10-CM | POA: Diagnosis not present

## 2020-03-13 NOTE — Telephone Encounter (Signed)
-----   Message from Wyline Mood, MD sent at 03/12/2020  1:15 PM EDT ----- Denton Meek inform allergy tests show elevated IGE to multiple items - suggest referral to allergy specialist   \C/c : Carlean Jews, NP   Dr Wyline Mood MD,MRCP [facility]) Gastroenterology/Hepatology Pager: [phone]

## 2020-03-13 NOTE — Telephone Encounter (Signed)
Spoke with pt and informed him of lab results and Dr. Johnney Killian recommendations. I also informed pt that the lab did not receive a specimen for the Celiac disease screening which means pt would need to visit the lab again. Pt verbalized understanding and agrees.

## 2020-03-14 LAB — GI PROFILE, STOOL, PCR

## 2020-03-14 LAB — CELIAC DISEASE AB SCREEN W/RFX

## 2020-03-14 LAB — CLOSTRIDIUM DIFFICILE EIA: C difficile Toxins A+B, EIA: NEGATIVE

## 2020-03-14 LAB — CALPROTECTIN, FECAL: Calprotectin, Fecal: 24 ug/g (ref 0–120)

## 2020-03-17 ENCOUNTER — Encounter: Payer: Self-pay | Admitting: Gastroenterology

## 2020-03-20 ENCOUNTER — Telehealth (INDEPENDENT_AMBULATORY_CARE_PROVIDER_SITE_OTHER): Payer: 59 | Admitting: Gastroenterology

## 2020-03-20 DIAGNOSIS — R197 Diarrhea, unspecified: Secondary | ICD-10-CM

## 2020-03-20 NOTE — Progress Notes (Signed)
Jack Garza , MD [address]  [address]  [place], [place] [address]  Main: [phone]  Fax: [phone]   Primary Care Physician: Carlean Jews, NP  Virtual Visit via Telephone Note  I connected with patient on 03/20/20 at 11:15 AM EDT by telephone and verified that I am speaking with the correct person using two identifiers.   I discussed the limitations, risks, security and privacy concerns of performing an evaluation and management service by telephone and the availability of in person appointments. I also discussed with the patient that there may be a patient responsible charge related to this service. The patient expressed understanding and agreed to proceed.  Location of Patient: Home Location of Provider: Home Persons involved: Patient and provider only   History of Present Illness:   Follow-up visit for diarrhea  HPI: Jack Garza is a 24 y.o. male    Summary of history :  Initially referred and seen on 03/06/2020 for abdominal cramping with diarrhea.Previously has been seen by pediatric gastroenterology in 2016 at [facility] I reviewed the results and shows duodenal biopsies as well as esophageal biopsy, terminal ileal biopsy biopsies of the right side of the colon left side and transverse colon.  At that time he also had splenomegaly and duodenitis.  Did not have follow-up for the splenomegaly either.He has had longstanding diarrhea.  Up to 4 bowel movements a day.  Watery nonbloody.  Preceded with some abdominal cramping.  Certain foods make it worse.  He drinks a few cups of sweet tea daily.  No other artificial sugars in his diet.  No family history of IBD or colon cancer.   Interval history   03/06/2020-03/20/2020  03/07/2020 stool test for C. difficile, GI PCR was negative.  Celiac serology was negative.  Fecal calprotectin was normal.  H. pylori breath test was negative.  Food allergy profile showed elevated IgE to multiple food items.   None of which were significantly elevated.  Referred to allergy specialist.  03/13/2020 ultrasound spleen showed it was within normal limits.  Since he commenced on the low FODMAP diet he has noticed a significant improvement in his symptoms having only 2 bowel movements a day which is formed but on the softer side.  Current Outpatient Medications  Medication Sig Dispense Refill  . dicyclomine (BENTYL) 10 MG capsule Take 1 capsule (10 mg total) by mouth 4 (four) times daily -  before meals and at bedtime. 360 capsule 1   No current facility-administered medications for this visit.    Allergies as of 03/20/2020 - Review Complete 03/06/2020  Allergen Reaction Noted  . Gluten meal Nausea And Vomiting 06/25/2015  . Peanuts [peanut oil]  06/25/2015  . Soy allergy Nausea And Vomiting 06/25/2015  . Wheat bran Diarrhea 06/26/2015    Review of Systems:    All systems reviewed and negative except where noted in HPI.   Observations/Objective:  Labs: CMP     Component Value Date/Time   NA 140 01/01/2020 0936   K 4.5 01/01/2020 0936   CL 104 01/01/2020 0936   CO2 25 06/25/2015 0724   GLUCOSE 95 01/01/2020 0936   GLUCOSE 128 (H) 06/25/2015 0724   BUN 15 01/01/2020 0936   CREATININE 0.91 01/01/2020 0936   CALCIUM 9.7 01/01/2020 0936   PROT 6.8 01/01/2020 0936   ALBUMIN 4.9 01/01/2020 0936   AST 18 01/01/2020 0936   ALT 27 01/01/2020 0936   ALKPHOS 49 01/01/2020 0936   BILITOT  0.4 01/01/2020 0936   GFRNONAA 119 01/01/2020 0936   GFRAA 138 01/01/2020 0936   Lab Results  Component Value Date   WBC 4.7 01/01/2020   HGB 14.9 01/01/2020   HCT 42.5 01/01/2020   MCV 90 01/01/2020   PLT 238 01/01/2020    Imaging Studies: US SPLEEN (ABDOMEN LIMITED)  Result Date: 03/13/2020 CLINICAL DATA:  Splenomegaly. EXAM: ULTRASOUND ABDOMEN LIMITED COMPARISON:  June 25, 2015. FINDINGS: The spleen measures 14.1 x 9.8 x 5.2 cm with calculated volume of 378 cubic cm, which is within normal  limits. No focal abnormality is noted. IMPRESSION: Spleen has calculated volume of 378 cubic cm which is within normal limits. Electronically Signed   By: Marijo Conception M.D.   On: 03/13/2020 14:01    Assessment and Plan:   Jack Garza is a 24 y.o. y/o male here to follow-up for diarrhea.  Previously evaluated at [facility] by the pediatric gastroenterologist and from the limited records available I could tell that EGD and colonoscopy were negative.  Incidentally found splenomegaly in 2016.  Plan 1.  Continue low FODMAP diet.  Provided him the telephone number on file at the allergy clinic which she will contact to schedule an appointment to discuss food allergies.  Informed him that I will speak to him in 2 to 3 months time and if he diarrhea as discussed or if any worse can consider a course of Xifaxan for irritable bowel syndrome with diarrhea   I discussed the assessment and treatment plan with the patient. The patient was provided an opportunity to ask questions and all were answered. The patient agreed with the plan and demonstrated an understanding of the instructions.   The patient was advised to call back or seek an in-person evaluation if the symptoms worsen or if the condition fails to improve as anticipated.  I provided 12 minutes of non-face-to-face time during this encounter.  Dr [name] MD,MRCP [facility]) Gastroenterology/Hepatology Pager: [phone]   Speech recognition software was used to dictate this note.

## 2020-05-08 ENCOUNTER — Ambulatory Visit: Payer: 59 | Admitting: Allergy

## 2020-05-16 ENCOUNTER — Ambulatory Visit: Payer: 59

## 2020-05-16 ENCOUNTER — Other Ambulatory Visit: Payer: Self-pay

## 2020-05-16 DIAGNOSIS — Z0283 Encounter for blood-alcohol and blood-drug test: Secondary | ICD-10-CM

## 2020-05-16 NOTE — Progress Notes (Signed)
Pt presented for random UDS & ETOH both are complete.  Lab [employer] ID: [id number]

## 2020-06-16 NOTE — Progress Notes (Signed)
New Patient Note  RE: Jack Garza MRN: [id number] DOB: April 26, 1996 Date of Office Visit: 06/17/2020  Referring provider: Wyline Mood, MD Primary care provider: Carlean Jews, NP  Chief Complaint: Diarrhea  History of Present Illness: I had the pleasure of seeing Jack Garza for initial evaluation at the [facility] on 06/17/2020. He is a 24 y.o. male, who is referred here by Dr. Wyline Mood (GI) for the evaluation of food allergies.  Patient has been having issues with diarrhea and abdominal cramps since high school.  So far work up for following was negative - c diff, celiac and H. Pylori.   Patient noticed worsening symptoms after eating soy and gluten. Symptoms usually start with abdominal cramps, diarrhea and nausea/vomiting. Symptoms lasts for 30 minutes. Sesame seeds give similar symptoms as above.   Dairy causes some abdominal cramps. Has not tried lactose free products.   Patient does not consume peanuts and tree nuts on a regular basis. No issues with corn. No history of eczema. Gaining weight and appetite is good.   Dietary History: patient has been eating other foods including limited milk, eggs, peanut, treenuts, limited sesame, shellfish, seafood, meats, fruits and vegetables.   He reports reading labels and avoiding soy and wheat in diet completely. Trying to drink gluten free beer.   He does not have access to epinephrine autoinjector.   Past work up includes: Component     Latest Ref Rng & Units 03/06/2020  Class Description Allergens      Comment  Egg White IgE     Class 0 kU/L <0.10  Peanut IgE     Class I kU/L 0.50 (A)  Soybean IgE     Class 0/I kU/L 0.28 (A)  Milk IgE     Class 0 kU/L <0.10  Clam IgE     Class 0 kU/L <0.10  Shrimp IgE     Class 0 kU/L <0.10  Walnut IgE     Class 0/I kU/L 0.28 (A)  Codfish IgE     Class 0 kU/L <0.10  Scallop IgE     Class 0/I kU/L 0.16 (A)  Wheat IgE     Class I kU/L 0.54  (A)  Allergen Corn, IgE     Class 0/I kU/L 0.29 (A)  Sesame Seed IgE     Class I kU/L 0.49 (A)   GI OV: "03/07/2020 stool test for C. difficile, GI PCR was negative.  Celiac serology was negative.  Fecal calprotectin was normal.  H. pylori breath test was negative.  Food allergy profile showed elevated IgE to multiple food items.  None of which were significantly elevated.  Referred to allergy specialist.  03/13/2020 ultrasound spleen showed it was within normal limits.  Since he commenced on the low FODMAP diet he has noticed a significant improvement in his symptoms having only 2 bowel movements a day which is formed but on the softer side."  Assessment and Plan: Jack Garza is a 24 y.o. male with: Adverse food reaction Issues with diarrhea and abdominal cramps since high school.  Followed by GI and work-up for C. difficile, celiac and H. pylori was negative to date.  Blood work showed some borderline positives to peanut, soy, walnut, scallop, wheat, corn and sesame seed.  Notices worsening symptoms after eating soy and gluten-containing foods.  Usually last for 30 minutes.  Dairy causes some abdominal cramps.  No issues with corn, peanut, tree nuts or seafood that he is aware  of.  No known history of eczema.  Today's skin testing showed: Borderline positive to peanut, pistachio and oats.  Results given.   Given these test results with the borderline bloodwork results, he may have some component of non-IgE mediated adverse food reaction.   Continue to avoid foods that are bothersome - sesame, soy and gluten.  Okay to eat other foods as tolerated.  For mild symptoms you can take over the counter antihistamines such as Benadryl and monitor symptoms closely. If symptoms worsen or if you have severe symptoms including breathing issues, throat closure, significant swelling, whole body hives, severe diarrhea and vomiting, lightheadedness then seek immediate medical care.  Patient may be lactose  intolerant - try lactose free dairy products.  Return in about 1 year (around 06/17/2021).  Other allergy screening: Asthma: as a child but no inhaler use for years.  Rhino conjunctivitis: no Medication allergy: no Hymenoptera allergy: no Urticaria: no Eczema:no History of recurrent infections suggestive of immunodeficency: no  Diagnostics: Skin Testing: Select foods. Borderline positive to peanut, pistachio and oats.  Results discussed with patient/family.  Food Adult Perc - 06/17/20 0900    Time Antigen Placed 0901    Allergen Manufacturer Waynette Buttery    Location Back    Number of allergen test 25     Control-buffer 50% Glycerol Negative    Control-Histamine 1 mg/ml 2+    1. Peanut --   +/-   2. Soybean Negative    3. Wheat Negative    4. Sesame Negative    5. Milk, cow Negative    6. Egg White, Chicken Negative    7. Casein Negative    8. Shellfish Mix Negative    9. Fish Mix Negative    10. Cashew Negative    11. Pecan Food Negative    12. Walnut Food Negative    13. Almond Negative    14. Hazelnut Negative    15. [place] nut Negative    16. Coconut Negative    17. Pistachio --   +/-   29. Scallops Negative    30. Barley Negative    31. Oat  --   +/-   32. Rye  Negative    33. Hops Negative    53. Corn Negative           Past Medical History: Patient Active Problem List   Diagnosis Date Noted  . Adverse food reaction 06/17/2020  . Irritable bowel syndrome with diarrhea 01/16/2020  . Examination, physical, employee 07/18/2019  . Encounter for physical examination related to employment 07/18/2019  . Chronic diarrhea 08/18/2015  . Duodenitis 08/18/2015  . Diarrhea 07/09/2015  . Right upper quadrant abdominal pain 07/09/2015  . Non-intractable vomiting with nausea 07/09/2015  . Sprain of rhomboid 11/07/2013   Past Medical History:  Diagnosis Date  . Asthma    Past Surgical History: Past Surgical History:  Procedure Laterality Date  . COLONOSCOPY  2016    . TONSILLECTOMY    . TYMPANOSTOMY TUBE PLACEMENT     Medication List:  Current Outpatient Medications  Medication Sig Dispense Refill  . dicyclomine (BENTYL) 10 MG capsule Take 1 capsule (10 mg total) by mouth 4 (four) times daily -  before meals and at bedtime. (Patient not taking: Reported on 06/17/2020) 360 capsule 1   No current facility-administered medications for this visit.   Allergies: Allergies  Allergen Reactions  . Gluten Meal Nausea And Vomiting  . Peanuts [Peanut Oil]     Peanut allergy  showed up on allergy testing per father.  Patient has eaten peanuts and has not had a reaction that parents are aware of.  . Soy Allergy Nausea And Vomiting  . Wheat Bran Diarrhea   Social History: Social History   Socioeconomic History  . Marital status: Single    Spouse name: Not on file  . Number of children: Not on file  . Years of education: Not on file  . Highest education level: Not on file  Occupational History  . Not on file  Tobacco Use  . Smoking status: Never Smoker  . Smokeless tobacco: Never Used  Vaping Use  . Vaping Use: Never used  Substance and Sexual Activity  . Alcohol use: Yes    Comment: ocassionally   . Drug use: No  . Sexual activity: Not on file  Other Topics Concern  . Not on file  Social History Narrative  . Not on file   Social Determinants of Health   Financial Resource Strain:   . Difficulty of Paying Living Expenses: Not on file  Food Insecurity:   . Worried About Programme researcher, broadcasting/film/video in the Last Year: Not on file  . Ran Out of Food in the Last Year: Not on file  Transportation Needs:   . Lack of Transportation (Medical): Not on file  . Lack of Transportation (Non-Medical): Not on file  Physical Activity:   . Days of Exercise per Week: Not on file  . Minutes of Exercise per Session: Not on file  Stress:   . Feeling of Stress : Not on file  Social Connections:   . Frequency of Communication with Friends and Family: Not on file  .  Frequency of Social Gatherings with Friends and Family: Not on file  . Attends Religious Services: Not on file  . Active Member of Clubs or Organizations: Not on file  . Attends Banker Meetings: Not on file  . Marital Status: Not on file   Lives in a [address] year old home. Smoking: denies Occupation: Ecologist HistorySurveyor, minerals in the house: no Engineer, civil (consulting) in the family room: no Carpet in the bedroom: yes Heating: gas Cooling: central Pet: yes 1 dog x 3 yrs  Family History: Family History  Problem Relation Age of Onset  . Hypertension Mother   . Hypertension Father   . Diabetes Father    Problem                               Relation Asthma                                   No  Eczema                                No  Food allergy                          No  Allergic rhino conjunctivitis     No   Review of Systems  Constitutional: Negative for appetite change, chills, fever and unexpected weight change.  HENT: Negative for congestion and rhinorrhea.   Eyes: Negative for itching.  Respiratory: Negative for cough, chest tightness, shortness of breath and wheezing.   Cardiovascular: Negative for chest pain.  Gastrointestinal: Negative for abdominal pain.  Genitourinary: Negative for difficulty urinating.  Skin: Negative for rash.  Neurological: Negative for headaches.   Objective: BP 120/82   Pulse 71   Temp 99.1 F (37.3 C) (Oral)   Resp 16   Ht 5\' 8"  (1.727 m)   Wt 205 lb 12.8 oz (93.4 kg)   SpO2 97%   BMI 31.29 kg/m  Body mass index is 31.29 kg/m. Physical Exam Vitals and nursing note reviewed.  Constitutional:      Appearance: Normal appearance. He is well-developed.  HENT:     Head: Normocephalic and atraumatic.     Right Ear: External ear normal. There is impacted cerumen.     Left Ear: External ear normal.     Nose: Nose normal.     Mouth/Throat:     Mouth: Mucous membranes are moist.     Pharynx: Oropharynx is  clear.  Eyes:     Conjunctiva/sclera: Conjunctivae normal.  Cardiovascular:     Rate and Rhythm: Normal rate and regular rhythm.     Heart sounds: Normal heart sounds. No murmur heard.  No friction rub. No gallop.   Pulmonary:     Effort: Pulmonary effort is normal.     Breath sounds: Normal breath sounds. No wheezing, rhonchi or rales.  Abdominal:     Palpations: Abdomen is soft.  Musculoskeletal:     Cervical back: Neck supple.  Skin:    General: Skin is warm.     Findings: No rash.  Neurological:     Mental Status: He is alert and oriented to person, place, and time.  Psychiatric:        Behavior: Behavior normal.    The plan was reviewed with the patient/family, and all questions/concerned were addressed.  It was my pleasure to see Jack Hollingsheadlexander today and participate in his care. Please feel free to contact me with any questions or concerns.  Sincerely,  Wyline MoodYoon Kandra Graven, DO Allergy & Immunology  [facility] [place] office: [phone] [facility] office: [phone] [place] office: [phone]

## 2020-06-17 ENCOUNTER — Encounter: Payer: Self-pay | Admitting: Allergy

## 2020-06-17 ENCOUNTER — Ambulatory Visit (INDEPENDENT_AMBULATORY_CARE_PROVIDER_SITE_OTHER): Payer: 59 | Admitting: Allergy

## 2020-06-17 ENCOUNTER — Other Ambulatory Visit: Payer: Self-pay

## 2020-06-17 VITALS — BP 120/82 | HR 71 | Temp 99.1°F | Resp 16 | Ht 68.0 in | Wt 205.8 lb

## 2020-06-17 DIAGNOSIS — T781XXD Other adverse food reactions, not elsewhere classified, subsequent encounter: Secondary | ICD-10-CM | POA: Diagnosis not present

## 2020-06-17 DIAGNOSIS — T781XXA Other adverse food reactions, not elsewhere classified, initial encounter: Secondary | ICD-10-CM | POA: Insufficient documentation

## 2020-06-17 NOTE — Assessment & Plan Note (Signed)
Issues with diarrhea and abdominal cramps since high school.  Followed by GI and work-up for C. difficile, celiac and H. pylori was negative to date.  Blood work showed some borderline positives to peanut, soy, walnut, scallop, wheat, corn and sesame seed.  Notices worsening symptoms after eating soy and gluten-containing foods.  Usually last for 30 minutes.  Dairy causes some abdominal cramps.  No issues with corn, peanut, tree nuts or seafood that he is aware of.  No known history of eczema.  Today's skin testing showed: Borderline positive to peanut, pistachio and oats.  Results given.   Given these test results with the borderline bloodwork results, he may have some component of non-IgE mediated adverse food reaction.   Continue to avoid foods that are bothersome - sesame, soy and gluten.  Okay to eat other foods as tolerated.  For mild symptoms you can take over the counter antihistamines such as Benadryl and monitor symptoms closely. If symptoms worsen or if you have severe symptoms including breathing issues, throat closure, significant swelling, whole body hives, severe diarrhea and vomiting, lightheadedness then seek immediate medical care.  Patient may be lactose intolerant - try lactose free dairy products.

## 2020-06-17 NOTE — Patient Instructions (Addendum)
Today's skin testing showed: Borderline positive to peanut, pistachio and oats.  Results given.   Continue to avoid foods that are bothersome - sesame, soy and gluten. Okay to eat other foods as tolerated.  You most likely have intolerances to these foods. For mild symptoms you can take over the counter antihistamines such as Benadryl and monitor symptoms closely. If symptoms worsen or if you have severe symptoms including breathing issues, throat closure, significant swelling, whole body hives, severe diarrhea and vomiting, lightheadedness then seek immediate medical care.  You may be lactose intolerance - try lactaid free dairy products.  Follow up in 1 year or sooner if needed.

## 2020-06-21 ENCOUNTER — Telehealth: Payer: Self-pay | Admitting: Registered Nurse

## 2020-06-21 DIAGNOSIS — R7989 Other specified abnormal findings of blood chemistry: Secondary | ICD-10-CM

## 2020-06-21 NOTE — Telephone Encounter (Signed)
TSH at annual physical low 0.427 new recommended patient repeat nonfasting TSH in 4 weeks May 2021 still now completed per Epic.  My chart message sent to patient reminder to call and schedule.

## 2020-06-25 ENCOUNTER — Ambulatory Visit: Payer: 59 | Admitting: Gastroenterology

## 2020-08-14 ENCOUNTER — Telehealth: Payer: Self-pay | Admitting: Registered Nurse

## 2020-08-14 DIAGNOSIS — R7989 Other specified abnormal findings of blood chemistry: Secondary | ICD-10-CM

## 2020-08-14 NOTE — Telephone Encounter (Signed)
TSH at annual physical low 0.427 new recommended patient repeat nonfasting TSH in 4 weeks May 2021 still not completed per Epic.  Telephone message left for patient to call and schedule nonfasting blood draw.  Noted patient being seen by GI now also diarrhea/adverse food reaction.

## 2020-08-15 ENCOUNTER — Telehealth: Payer: Self-pay | Admitting: Registered Nurse

## 2020-08-15 NOTE — Telephone Encounter (Signed)
Patient reported he would schedule lab appt at a later time as having too many appts with another doctor at this time.  Message left with front desk receptionist.

## 2021-01-21 NOTE — Progress Notes (Deleted)
Scheduled to complete physical 01/30/21 - Provider TBD.  AMD

## 2021-01-22 DIAGNOSIS — Z0000 Encounter for general adult medical examination without abnormal findings: Secondary | ICD-10-CM

## 2021-01-28 ENCOUNTER — Ambulatory Visit: Payer: Self-pay

## 2021-01-28 ENCOUNTER — Other Ambulatory Visit: Payer: Self-pay

## 2021-01-28 DIAGNOSIS — Z0000 Encounter for general adult medical examination without abnormal findings: Secondary | ICD-10-CM

## 2021-01-28 LAB — POCT URINALYSIS DIPSTICK
Bilirubin, UA: NEGATIVE
Blood, UA: NEGATIVE
Glucose, UA: NEGATIVE
Ketones, UA: NEGATIVE
Leukocytes, UA: NEGATIVE
Nitrite, UA: NEGATIVE
Protein, UA: POSITIVE — AB
Spec Grav, UA: 1.015 (ref 1.010–1.025)
Urobilinogen, UA: 0.2 E.U./dL
pH, UA: 7.5 (ref 5.0–8.0)

## 2021-01-28 NOTE — Progress Notes (Signed)
Pt scheduled to complete physical 01/30/21 with Jack Garza. CL,RMA

## 2021-01-29 LAB — CMP12+LP+TP+TSH+6AC+CBC/D/PLT
ALT: 29 IU/L (ref 0–44)
AST: 13 IU/L (ref 0–40)
Albumin/Globulin Ratio: 1.8 (ref 1.2–2.2)
Albumin: 4.6 g/dL (ref 4.1–5.2)
Alkaline Phosphatase: 47 IU/L (ref 44–121)
BUN/Creatinine Ratio: 18 (ref 9–20)
BUN: 15 mg/dL (ref 6–20)
Basophils Absolute: 0 10*3/uL (ref 0.0–0.2)
Basos: 0 %
Bilirubin Total: 0.6 mg/dL (ref 0.0–1.2)
Calcium: 9.4 mg/dL (ref 8.7–10.2)
Chloride: 102 mmol/L (ref 96–106)
Chol/HDL Ratio: 5.8 ratio — ABNORMAL HIGH (ref 0.0–5.0)
Cholesterol, Total: 210 mg/dL — ABNORMAL HIGH (ref 100–199)
Creatinine, Ser: 0.85 mg/dL (ref 0.76–1.27)
EOS (ABSOLUTE): 0.2 10*3/uL (ref 0.0–0.4)
Eos: 4 %
Estimated CHD Risk: 1.2 times avg. — ABNORMAL HIGH (ref 0.0–1.0)
Free Thyroxine Index: 2.1 (ref 1.2–4.9)
GGT: 21 IU/L (ref 0–65)
Globulin, Total: 2.5 g/dL (ref 1.5–4.5)
Glucose: 94 mg/dL (ref 65–99)
HDL: 36 mg/dL — ABNORMAL LOW (ref >39)
Hematocrit: 45.9 % (ref 37.5–51.0)
Hemoglobin: 15.7 g/dL (ref 13.0–17.7)
Immature Grans (Abs): 0 10*3/uL (ref 0.0–0.1)
Immature Granulocytes: 0 %
Iron: 108 ug/dL (ref 38–169)
LDH: 144 IU/L (ref 121–224)
LDL Chol Calc (NIH): 135 mg/dL — ABNORMAL HIGH (ref 0–99)
Lymphocytes Absolute: 2.1 10*3/uL (ref 0.7–3.1)
Lymphs: 38 %
MCH: 29.8 pg (ref 26.6–33.0)
MCHC: 34.2 g/dL (ref 31.5–35.7)
MCV: 87 fL (ref 79–97)
Monocytes Absolute: 0.4 10*3/uL (ref 0.1–0.9)
Monocytes: 8 %
Neutrophils Absolute: 2.7 10*3/uL (ref 1.4–7.0)
Neutrophils: 50 %
Phosphorus: 2.7 mg/dL — ABNORMAL LOW (ref 2.8–4.1)
Platelets: 261 10*3/uL (ref 150–450)
Potassium: 4.9 mmol/L (ref 3.5–5.2)
RBC: 5.26 x10E6/uL (ref 4.14–5.80)
RDW: 12.2 % (ref 11.6–15.4)
Sodium: 140 mmol/L (ref 134–144)
T3 Uptake Ratio: 26 % (ref 24–39)
T4, Total: 8.1 ug/dL (ref 4.5–12.0)
TSH: 0.772 u[IU]/mL (ref 0.450–4.500)
Total Protein: 7.1 g/dL (ref 6.0–8.5)
Triglycerides: 214 mg/dL — ABNORMAL HIGH (ref 0–149)
Uric Acid: 7.1 mg/dL (ref 3.8–8.4)
VLDL Cholesterol Cal: 39 mg/dL (ref 5–40)
WBC: 5.4 10*3/uL (ref 3.4–10.8)
eGFR: 125 mL/min/{1.73_m2} (ref >59)

## 2021-01-30 ENCOUNTER — Other Ambulatory Visit: Payer: Self-pay

## 2021-01-30 ENCOUNTER — Encounter: Payer: Self-pay | Admitting: Nurse Practitioner

## 2021-01-30 ENCOUNTER — Ambulatory Visit: Payer: Self-pay | Admitting: Nurse Practitioner

## 2021-01-30 VITALS — BP 133/86 | HR 85 | Temp 98.0°F | Resp 14 | Ht 67.0 in | Wt 203.0 lb

## 2021-01-30 DIAGNOSIS — Z0000 Encounter for general adult medical examination without abnormal findings: Secondary | ICD-10-CM

## 2021-01-30 NOTE — Progress Notes (Signed)
Subjective:    Patient ID: Jack Garza, male    DOB: Feb 03, 1996, 25 y.o.   MRN: [id number]  HPI Jack Garza is a 25 y.o. male who presents to the [facility] for his annual physical exam. He is employed by the [employer] and has been there for 3.5 years. He states that he really enjoys his job. He denies any concerns or problems today.   Past Medical History:  Diagnosis Date  . Asthma     IBS  Past Surgical History:  Procedure Laterality Date  . COLONOSCOPY  2016  . TONSILLECTOMY    . TYMPANOSTOMY TUBE PLACEMENT     No current outpatient medications on file prior to visit.   No current facility-administered medications on file prior to visit.    Allergies  Allergen Reactions  . Gluten Meal Nausea And Vomiting  . Peanuts [Peanut Oil]     Peanut allergy showed up on allergy testing per father.  Patient has eaten peanuts and has not had a reaction that parents are aware of.  . Soy Allergy Nausea And Vomiting  . Wheat Bran Diarrhea  SH: Denies use of tobacco, ETOH or drugs, lives with his parents and feels safe at home Immunizations: Did not have flu or Covid vaccine Diet/Exercise:   Review of Systems  HENT: Positive for sneezing.        Seasonal allergies  All other systems reviewed and are negative.      Objective: BP 133/86   Pulse 85   Temp 98 F (36.7 C)   Resp 14   Ht 5\' 7"  (1.702 m)   Wt 203 lb (92.1 kg)   SpO2 97%   BMI 31.79 kg/m     Physical Exam Vitals and nursing note reviewed.  Constitutional:      General: He is not in acute distress.    Appearance: Normal appearance.  HENT:     Head: Normocephalic.     Jaw: There is normal jaw occlusion.     Right Ear: Tympanic membrane, ear canal and external ear normal.     Left Ear: External ear normal. There is impacted cerumen.     Nose: Nose normal.     Mouth/Throat:     Mouth: Mucous membranes are moist.     Pharynx: Oropharynx is clear.  Eyes:     General: Lids are normal.  Gaze aligned appropriately. No visual field deficit.    Extraocular Movements: Extraocular movements intact.     Right eye: No nystagmus.     Conjunctiva/sclera: Conjunctivae normal.     Pupils: Pupils are equal, round, and reactive to light.     Funduscopic exam:    Right eye: No papilledema. Red reflex present.        Left eye: No papilledema. Red reflex present. Neck:     Thyroid: No thyroid mass or thyroid tenderness.     Vascular: Normal carotid pulses. No carotid bruit or JVD.     Trachea: Trachea normal.  Cardiovascular:     Rate and Rhythm: Normal rate and regular rhythm.     Heart sounds: No murmur heard.   Pulmonary:     Effort: Pulmonary effort is normal.     Breath sounds: Normal breath sounds.  Abdominal:     General: Bowel sounds are normal. There is no distension.     Palpations: Abdomen is soft.     Tenderness: There is no abdominal tenderness. There is no right CVA tenderness  or left CVA tenderness.  Musculoskeletal:        General: Normal range of motion.     Cervical back: Normal range of motion. No rigidity. No pain with movement, spinous process tenderness or muscular tenderness.     Right lower leg: No edema.     Left lower leg: No edema.  Lymphadenopathy:     Cervical: No cervical adenopathy.  Skin:    General: Skin is warm and dry.  Neurological:     Mental Status: He is alert.     Cranial Nerves: No facial asymmetry.     Sensory: Sensation is intact.     Motor: No weakness or pronator drift.     Coordination: Romberg sign negative. Coordination normal. Finger-Nose-Finger Test and Heel to [employer] Test normal.     Gait: Gait is intact.     Deep Tendon Reflexes:     Reflex Scores:      Bicep reflexes are 2+ on the right side and 2+ on the left side.      Brachioradialis reflexes are 2+ on the right side and 2+ on the left side.      Patellar reflexes are 2+ on the right side and 2+ on the left side.    Comments: Ambulatory with steady gait, stands on  one foot without difficulty. Grips are equal, radial pulses 2+.   Psychiatric:        Mood and Affect: Mood normal.        Behavior: Behavior normal.       Assessment & Plan:  1. Annual physical exam Discussed with the patient clinical and lab findings. Discussed elevated Cholesterol and triglycerides. Discussed if trend continues will consider prescribing a statin. Also discussed BP. Ask patient to take his BP at work and return here in 2 weeks for recheck. On arrival to the Clinic today his BP was slightly elevated but recheck prior to d/c 133/86. Patient given the opportunity to ask questions. All questioned fully answered and patient voices understanding.

## 2021-01-30 NOTE — Patient Instructions (Signed)
Health Maintenance, Male Adopting a healthy lifestyle and getting preventive care are important in promoting health and wellness. Ask your health care provider about:  The right schedule for you to have regular tests and exams.  Things you can do on your own to prevent diseases and keep yourself healthy. What should I know about diet, weight, and exercise? Eat a healthy diet  Eat a diet that includes plenty of vegetables, fruits, low-fat dairy products, and lean protein.  Do not eat a lot of foods that are high in solid fats, added sugars, or sodium.   Maintain a healthy weight Body mass index (BMI) is a measurement that can be used to identify possible weight problems. It estimates body fat based on height and weight. Your health care provider can help determine your BMI and help you achieve or maintain a healthy weight. Get regular exercise Get regular exercise. This is one of the most important things you can do for your health. Most adults should:  Exercise for at least 150 minutes each week. The exercise should increase your heart rate and make you sweat (moderate-intensity exercise).  Do strengthening exercises at least twice a week. This is in addition to the moderate-intensity exercise.  Spend less time sitting. Even light physical activity can be beneficial. Watch cholesterol and blood lipids Have your blood tested for lipids and cholesterol at 23 years of age, then have this test every 5 years. You may need to have your cholesterol levels checked more often if:  Your lipid or cholesterol levels are high.  You are older than 23 years of age.  You are at high risk for heart disease. What should I know about cancer screening? Many types of cancers can be detected early and may often be prevented. Depending on your health history and family history, you may need to have cancer screening at various ages. This may include screening for:  Colorectal cancer.  Prostate  cancer.  Skin cancer.  Lung cancer. What should I know about heart disease, diabetes, and high blood pressure? Blood pressure and heart disease  High blood pressure causes heart disease and increases the risk of stroke. This is more likely to develop in people who have high blood pressure readings, are of African descent, or are overweight.  Talk with your health care provider about your target blood pressure readings.  Have your blood pressure checked: ? Every 3-5 years if you are 18-39 years of age. ? Every year if you are 40 years old or older.  If you are between the ages of 65 and 75 and are a current or former smoker, ask your health care provider if you should have a one-time screening for abdominal aortic aneurysm (AAA). Diabetes Have regular diabetes screenings. This checks your fasting blood sugar level. Have the screening done:  Once every three years after age 45 if you are at a normal weight and have a low risk for diabetes.  More often and at a younger age if you are overweight or have a high risk for diabetes. What should I know about preventing infection? Hepatitis B If you have a higher risk for hepatitis B, you should be screened for this virus. Talk with your health care provider to find out if you are at risk for hepatitis B infection. Hepatitis C Blood testing is recommended for:  Everyone born from 1945 through 1965.  Anyone with known risk factors for hepatitis C. Sexually transmitted infections (STIs)  You should be screened each   year for STIs, including gonorrhea and chlamydia, if: ? You are sexually active and are younger than 23 years of age. ? You are older than 23 years of age and your health care provider tells you that you are at risk for this type of infection. ? Your sexual activity has changed since you were last screened, and you are at increased risk for chlamydia or gonorrhea. Ask your health care provider if you are at risk.  Ask your  health care provider about whether you are at high risk for HIV. Your health care provider may recommend a prescription medicine to help prevent HIV infection. If you choose to take medicine to prevent HIV, you should first get tested for HIV. You should then be tested every 3 months for as long as you are taking the medicine. Follow these instructions at home: Lifestyle  Do not use any products that contain nicotine or tobacco, such as cigarettes, e-cigarettes, and chewing tobacco. If you need help quitting, ask your health care provider.  Do not use street drugs.  Do not share needles.  Ask your health care provider for help if you need support or information about quitting drugs. Alcohol use  Do not drink alcohol if your health care provider tells you not to drink.  If you drink alcohol: ? Limit how much you have to 0-2 drinks a day. ? Be aware of how much alcohol is in your drink. In the [place] one drink equals one 12 oz bottle of beer (355 mL), one 5 oz glass of wine (148 mL), or one 1 oz glass of hard liquor (44 mL). General instructions  Schedule regular health, dental, and eye exams.  Stay current with your vaccines.  Tell your health care provider if: ? You often feel depressed. ? You have ever been abused or do not feel safe at home. Summary  Adopting a healthy lifestyle and getting preventive care are important in promoting health and wellness.  Follow your health care provider's instructions about healthy diet, exercising, and getting tested or screened for diseases.  Follow your health care provider's instructions on monitoring your cholesterol and blood pressure. This information is not intended to replace advice given to you by your health care provider. Make sure you discuss any questions you have with your health care provider. Document Revised: 09/07/2018 Document Reviewed: 09/07/2018 Elsevier Patient Education  2021 [employer].  Immunization Schedule, 67-8  Years Old  Vaccines are usually given at various ages, according to a schedule. Your health care provider will recommend vaccines for you based on your age, medical history, and lifestyle or other factors, such as travel or where you work. You may receive vaccines as individual doses or as more than one vaccine together in one shot (combination vaccine). Talk with your health care provider about the risks and benefits of combination vaccines. Recommended immunizations for 67-71 years old Influenza vaccine  You should get a dose of the influenza vaccine every year. Tetanus, diphtheria, and pertussis vaccine A vaccine that protects against tetanus, diphtheria, and pertussis is known as the Tdap vaccine. A vaccine that protects against tetanus and diphtheria is known as the Td vaccine.  You should only get the Td vaccine if you have had at least 1 dose of the Tdap vaccine.  You should get 1 dose of the Td or Tdap vaccine every 10 years, or you should get 1 dose of the Tdap vaccine if: ? You have not previously gotten a Tdap vaccine. ?  You do not know if you have ever gotten a Tdap vaccine. ? You are between 27 and [redacted] weeks pregnant. Measles, mumps, and rubella vaccine This is also known as the MMR vaccine. You may need to get the MMR vaccine if:  You need to catch up on doses you missed in the past.  You have not been given the vaccine before.  You do not have evidence of immunity (by a blood test). Pregnant women should not get the MMR vaccine during pregnancy because it may be harmful to the unborn baby. However, if you are not immune to measles, mumps, or rubella, you should get a dose of MMR vaccine one month or more before pregnancy or within days after delivery. Varicella vaccine This is also known as the VAR vaccine. You may need to get the VAR vaccine if:  You need to catch up on doses you missed in the past.  You have not been given the vaccine before.  You do not have evidence  of immunity (by a blood test).  You have certain high-risk conditions, such as HIV or AIDS. Pregnant women should not get the VAR vaccine during pregnancy because it may be harmful to the unborn baby. However, if you are not immune to chickenpox (varicella), you should get a dose of the VAR vaccine within days after delivery. Human papillomavirus vaccine This is also known as the HPV vaccine. You should get the HPV vaccine if:  You have not gotten the vaccine before. The vaccine is recommended for all adults through age 75.  You need to catch up on doses you missed in the past. Pneumococcal conjugate vaccine This is also known as the PCV13 vaccine. You should get the PCV13 vaccine as recommended if you have certain high-risk conditions. These include:  Diabetes.  Chronic conditions of the heart, lungs, or liver.  Conditions that affect the body's disease-fighting system (immune system). Pneumococcal polysaccharide vaccine This is also known as the PPSV23 vaccine. You should get the PPSV23 vaccine as recommended if you have certain high-risk conditions. These include:  Diabetes.  Chronic conditions of the heart, lungs, or liver.  Conditions that affect the immune system. Hepatitis A vaccine This is also known as the HepA vaccine. If you did not get the HepA vaccine previously, you should get it if:  You are at risk for a hepatitis A infection. You may be at risk for infection if you: ? Have chronic liver disease. ? Have HIV or AIDS. ? Are a man who has sex with men. ? Use drugs. ? Are homeless. ? May be exposed to hepatitis A through work. ? Travel to countries where hepatitis A is common. ? Are pregnant. ? Have or will have close contact with someone who was adopted from another country.  You are not at risk for infection but want protection from hepatitis A. Hepatitis B vaccine This is also known as the HepB vaccine. If you did not get the HepB vaccine previously, you  should get it if:  You are at risk for hepatitis B infection. You are at risk if you: ? Have chronic liver disease. ? Have HIV or AIDS. ? Have sex with a partner who has hepatitis B, or:  You have multiple sex partners.  You are a man who has sex with men. ? Use drugs. ? May be exposed to hepatitis B through work. ? Live with someone who has hepatitis B. ? Receive dialysis treatment. ? Have diabetes. ? Travel  to countries where hepatitis B is common. ? Are pregnant.  You are not at risk of infection but want protection from hepatitis B. Meningococcal conjugate vaccine This is also known as the MenACWY vaccine. You may need to get the MenACWY vaccine if you:  Have not been given the vaccine before.  Need to catch up on doses you missed in the past. This vaccine is especially important if you:  Do not have a spleen.  Have sickle cell disease.  Have HIV.  Take medicines that suppress your immune system.  Travel to countries where meningococcal disease is common.  Are exposed to Neisseria meningitidis at work. Serogroup B meningococcal vaccine This is also known as the MenB vaccine. You may need to get the MenB vaccine if you:  Have not been given the vaccine before.  Need to catch up on doses you missed in the past. This vaccine is especially important if you:  Do not have a spleen.  Have sickle cell disease.  Take medicines that suppress your immune system.  Are exposed to Neisseria meningitidis at work. Haemophilus influenzae type b vaccine This is also known as the Hib vaccine. Anyone older than 23 years of age is usually not given the Hib vaccine. However, if you have certain high-risk conditions, you may need to get this vaccine. These conditions include:  Not having a spleen.  Having received a stem cell transplant. Before you get a vaccine: Talk with your health care provider about which vaccines are right for you. This is especially important if:  You  previously had a reaction after getting a vaccine.  You have a weakened immune system. You may have a weakened immune system if you: ? Are taking medicines that reduce (suppress) the activity of your immune system. ? Are taking medicines to treat cancer (chemotherapy). ? Have HIV or AIDS.  You work in an environment where you may be exposed to a disease.  You plan to travel outside of the country.  You have a chronic illness, such as heart disease, kidney disease, diabetes, or lung disease.  You are pregnant, think you may be pregnant, or are planning to become pregnant. Summary  Before you get a vaccine, tell your health care provider if you have reacted to vaccines in the past or have a condition that weakens your immune system.  At 22-26 years, you should get a dose of the flu vaccine every year and a dose of the Td or Tdap vaccine every 10 years.  Depending on your medical history and your risk factors, you may need other vaccines. Ask your health care provider whether you are up to date on all your vaccines.  Women who are pregnant may not receive certain vaccines. Ask your health care provider whether you should receive any vaccines soon after you deliver your baby. This information is not intended to replace advice given to you by your health care provider. Make sure you discuss any questions you have with your health care provider. Document Revised: 07/11/2019 Document Reviewed: 07/11/2019 Elsevier Patient Education  2021 [place] and Cholesterol Restricted Eating Plan Eating a diet that limits fat and cholesterol may help lower your risk for heart disease and other conditions. Your body needs fat and cholesterol for basic functions, but eating too much of these things can be harmful to your health. Your health care provider may order lab tests to check your blood fat (lipid) and cholesterol levels. This helps your health care provider  understand your risk for certain  conditions and whether you need to make diet changes. Work with your health care provider or dietitian to make an eating plan that is right for you. Your plan includes:  Limit your fat intake to ______% or less of your total calories a day.  Limit your saturated fat intake to ______% or less of your total calories a day.  Limit the amount of cholesterol in your diet to less than _________mg a day.  Eat ___________ g of fiber a day. What are tips for following this plan? General guidelines  If you are overweight, work with your health care provider to lose weight safely. Losing just 5-10% of your body weight can improve your overall health and help prevent diseases such as diabetes and heart disease.  Avoid: ? Foods with added sugar. ? Fried foods. ? Foods that contain partially hydrogenated oils, including stick margarine, some tub margarines, cookies, crackers, and other baked goods.  Limit alcohol intake to no more than 1 drink a day for nonpregnant women and 2 drinks a day for men. One drink equals 12 oz of beer, 5 oz of wine, or 1 oz of hard liquor.   Reading food labels  Check food labels for: ? Trans fats, partially hydrogenated oils, or high amounts of saturated fat. Avoid foods that contain saturated fat and trans fat. ? The amount of cholesterol in each serving. Try to eat no more than 200 mg of cholesterol each day. ? The amount of fiber in each serving. Try to eat at least 20-30 g of fiber each day.  Choose foods with healthy fats, such as: ? Monounsaturated and polyunsaturated fats. These include olive and canola oil, flaxseeds, walnuts, almonds, and seeds. ? Omega-3 fats. These are found in foods such as salmon, mackerel, sardines, tuna, flaxseed oil, and ground flaxseeds.  Choose grain products that have whole grains. Look for the word "whole" as the first word in the ingredient list. Cooking  Cook foods using methods other than frying. Baking, boiling, grilling, and  broiling are some healthy options.  Eat more home-cooked food and less restaurant, buffet, and fast food.  Avoid cooking using saturated fats. ? Animal sources of saturated fats include meats, butter, and cream. ? Plant sources of saturated fats include palm oil, palm kernel oil, and coconut oil. Meal planning  At meals, imagine dividing your plate into fourths: ? Fill one-half of your plate with vegetables and green salads. ? Fill one-fourth of your plate with whole grains. ? Fill one-fourth of your plate with lean protein foods.  Eat fish that is high in omega-3 fats at least two times a week.  Eat more foods that contain fiber, such as whole grains, beans, apples, broccoli, carrots, peas, and barley. These foods help promote healthy cholesterol levels in the blood.   Recommended foods Grains  Whole grains, such as whole wheat or whole grain breads, crackers, cereals, and pasta. Unsweetened oatmeal, bulgur, barley, quinoa, or brown rice. Corn or whole wheat flour tortillas. Vegetables  Fresh or frozen vegetables (raw, steamed, roasted, or grilled). Green salads. Fruits  All fresh, canned (in natural juice), or frozen fruits. Meats and other protein foods  Ground beef (85% or leaner), grass-fed beef, or beef trimmed of fat. Skinless chicken or [place]. Ground chicken or [place]. Pork trimmed of fat. All fish and seafood. Egg whites. Dried beans, peas, or lentils. Unsalted nuts or seeds. Unsalted canned beans. Natural nut butters without added sugar and oil. Dairy  Low-fat or nonfat dairy products, such as skim or 1% milk, 2% or reduced-fat cheeses, low-fat and fat-free ricotta or cottage cheese, or plain low-fat and nonfat yogurt. Fats and oils  Tub margarine without trans fats. Light or reduced-fat mayonnaise and salad dressings. Avocado. Olive, canola, sesame, or safflower oils. The items listed above may not be a complete list of foods and beverages you can eat. Contact a  dietitian for more information. Foods to avoid Grains  White bread. White pasta. White rice. Cornbread. Bagels, pastries, and croissants. Crackers and snack foods that contain trans fat and hydrogenated oils. Vegetables  Vegetables cooked in cheese, cream, or butter sauce. Fried vegetables. Fruits  Canned fruit in heavy syrup. Fruit in cream or butter sauce. Fried fruit. Meats and other protein foods  Fatty cuts of meat. Ribs, chicken wings, bacon, sausage, bologna, salami, chitterlings, fatback, hot dogs, bratwurst, and packaged lunch meats. Liver and organ meats. Whole eggs and egg yolks. Chicken and [place] with skin. Fried meat. Dairy  Whole or 2% milk, cream, half-and-half, and cream cheese. Whole milk cheeses. Whole-fat or sweetened yogurt. Full-fat cheeses. Nondairy creamers and whipped toppings. Processed cheese, cheese spreads, and cheese curds. Beverages  Alcohol. Sugar-sweetened drinks such as sodas, lemonade, and fruit drinks. Fats and oils  Butter, stick margarine, lard, shortening, ghee, or bacon fat. Coconut, palm kernel, and palm oils. Sweets and desserts  Corn syrup, sugars, honey, and molasses. Candy. Jam and jelly. Syrup. Sweetened cereals. Cookies, pies, cakes, donuts, muffins, and ice cream. The items listed above may not be a complete list of foods and beverages you should avoid. Contact a dietitian for more information. Summary  Your body needs fat and cholesterol for basic functions. However, eating too much of these things can be harmful to your health.  Work with your health care provider and dietitian to follow a diet low in fat and cholesterol. Doing this may help lower your risk for heart disease and other conditions.  Choose healthy fats, such as monounsaturated and polyunsaturated fats, and foods high in omega-3 fatty acids.  Eat fiber-rich foods, such as whole grains, beans, peas, fruits, and vegetables.  Limit or avoid alcohol, fried foods, and  foods high in saturated fats, partially hydrogenated oils, and sugar. This information is not intended to replace advice given to you by your health care provider. Make sure you discuss any questions you have with your health care provider. Document Revised: 05/15/2020 Document Reviewed: 01/17/2020 Elsevier Patient Education  2021 [place].  Triglycerides Test Why am I having this test? Triglycerides are a type of fat in the body. Having a high level of triglycerides can increase your risk for heart disease. You may have this test as part of a routine physical exam. Health care providers recommend that adults have this test at least once every 5 years. If you have risk factors for heart disease or are being treated for high triglycerides, you may need to have this test more often. What is being tested? This test measures the amount of triglycerides in your blood. Triglycerides are naturally present in the body, and you also take in triglycerides by eating certain foods. Triglycerides may be measured as part of a test called a lipid profile, which tests triglycerides and cholesterol levels. What kind of sample is taken? A blood sample is required for this test. It may be collected by inserting a needle into a blood vessel, or by pricking a fingertip with a small needle (finger stick).   How  do I prepare for this test?  Follow instructions from your health care provider about changing or stopping your regular medicines.  Do not eat or drink anything except water starting 9-12 hours before your test, or as long as told by your health care provider.  Do not drink alcohol starting at least 24 hours before your test.  Follow any instructions from your health care provider about dietary restrictions before your test. Tell a health care provider about:  All medicines you are taking, including vitamins, herbs, eye drops, creams, and over-the-counter medicines.  Any blood disorders you  have.  Any medical conditions you have. How are the results reported? Your test results will be reported as a value that indicates how many triglycerides are in your blood. This will be given as milligrams of triglycerides per deciliter of blood (mg/dL). Your health care provider will compare your results to normal values that were established after testing a large group of people (reference ranges). Reference ranges may vary among labs and hospitals. For this test, common reference ranges are:  Adults: ? Male: 40-160 mg/dL or 0.45-1.81 mmol/L (SI units). ? Male: 35-135 mg/dL or 0.40-1.52 mmol/L (SI units).  Teens 4-43 years old: ? Male: 40-163 mg/dL. ? Male: 40-128 mg/dL.  Children 49-58 years old: ? Male: 36-138 mg/dL. ? Male: 41-138 mg/dL.  Children 41-61 years old: ? Male: 31-108 mg/dL. ? Male: 35-114 mg/dL.  Children 5 years or younger: ? Male: 30-86 mg/dL. ? Male: 32-99 mg/dL. What do the results mean? Results that are within the reference range are considered normal. This means that you have a normal amount of triglycerides in your blood. Results that are higher than your reference range mean that there are too many triglycerides in your blood. This may mean that you:  Have a higher risk of heart disease.  Have certain diseases that cause high triglycerides, such as diabetes.  Are taking certain medicines such as estrogens and oral contraceptives. Results that are lower than your reference range mean that there are too few triglycerides in your blood. This may mean that you are not getting enough nutrients in your diet (malnutrition). Talk with your health care provider about what your results mean. Questions to ask your health care provider Ask your health care provider, or the department that is doing the test:  When will my results be ready?  How will I get my results?  What are my treatment options?  What other tests do I need?  What are my next  steps? Summary  Triglycerides are a type of fat in the body. Having a high level of triglycerides can increase your risk for heart disease.  You may have this test as part of a routine physical exam. Triglycerides may be measured as part of a test called a lipid profile, which tests triglycerides and cholesterol.  Talk with your health care provider about what your results mean. This information is not intended to replace advice given to you by your health care provider. Make sure you discuss any questions you have with your health care provider. Document Revised: 01/03/2020 Document Reviewed: 01/03/2020 Elsevier Patient Education  2021 [employer].

## 2021-07-26 DIAGNOSIS — L03114 Cellulitis of left upper limb: Secondary | ICD-10-CM | POA: Diagnosis not present

## 2021-09-15 ENCOUNTER — Encounter: Payer: Self-pay | Admitting: Physician Assistant

## 2021-09-15 ENCOUNTER — Other Ambulatory Visit: Payer: Self-pay

## 2021-09-15 ENCOUNTER — Ambulatory Visit: Payer: 59 | Admitting: Physician Assistant

## 2021-09-15 DIAGNOSIS — R030 Elevated blood-pressure reading, without diagnosis of hypertension: Secondary | ICD-10-CM | POA: Diagnosis not present

## 2021-09-15 DIAGNOSIS — R300 Dysuria: Secondary | ICD-10-CM

## 2021-09-15 DIAGNOSIS — N5082 Scrotal pain: Secondary | ICD-10-CM

## 2021-09-15 LAB — POCT URINALYSIS DIPSTICK
Bilirubin, UA: NEGATIVE
Blood, UA: NEGATIVE
Glucose, UA: NEGATIVE
Ketones, UA: NEGATIVE
Leukocytes, UA: NEGATIVE
Nitrite, UA: NEGATIVE
Protein, UA: NEGATIVE
Spec Grav, UA: 1.025 (ref 1.010–1.025)
Urobilinogen, UA: 0.2 E.U./dL
pH, UA: 6 (ref 5.0–8.0)

## 2021-09-15 MED ORDER — CIPROFLOXACIN HCL 500 MG PO TABS
500.0000 mg | ORAL_TABLET | Freq: Two times a day (BID) | ORAL | 0 refills | Status: AC
Start: 2021-09-15 — End: 2021-09-25

## 2021-09-15 NOTE — Progress Notes (Signed)
[facility] [address] [place], [place] [address]  Internal MEDICINE  Office Visit Note  Patient Name: Jack Garza  [id number]  [id number]  Date of Service: 09/15/2021  Chief Complaint  Patient presents with   Acute Visit    Scrotum/pelvic pain, started about 2 weeks ago, did self exam and did not notice any changes      HPI Pt is here for a sick visit with his GF. -No discomfort with urination, no blood in urine -2 weeks of discomfort in testicles, but got better for the last few days -Describes more left side soreness. Denies worsening with positional changes and no heaviness sensation. Non tender to the touch and has done self exams that did not reveal any changes  -Denies any possibility of STI. No rashes or color changes. No known injuries either. 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 He does his physicals through the dept and states his bp has been normal when checked. Will monitor at home/the station  Current Medication:  Outpatient Encounter Medications as of 09/15/2021  Medication Sig   ciprofloxacin (CIPRO) 500 MG tablet Take 1 tablet (500 mg total) by mouth 2 (two) times daily for 10 days.   No facility-administered encounter medications on file as of 09/15/2021.      Medical History: Past Medical History:  Diagnosis Date   Asthma      Vital Signs: BP (!) 142/88 Comment: 161/93   Pulse 79    Temp 98.6 F (37 C)    Resp 16    Ht 5\' 8"  (1.727 m)    Wt 212 lb 12.8 oz (96.5 kg)    SpO2 97%    BMI 32.36 kg/m    Review of Systems  Constitutional:  Negative for fatigue and fever.  HENT:  Negative for congestion, mouth sores and postnasal drip.   Respiratory:  Negative for cough.   Cardiovascular:  Negative for chest pain.  Genitourinary:  Positive for testicular pain. Negative for difficulty urinating, dysuria, flank pain, genital sores,  hematuria, penile discharge, penile pain, penile swelling and scrotal swelling.  Psychiatric/Behavioral: Negative.     Physical Exam Vitals and nursing note reviewed.  Constitutional:      General: He is not in acute distress.    Appearance: He is well-developed. He is obese. He is not diaphoretic.  HENT:     Head: Normocephalic and atraumatic.     Mouth/Throat:     Pharynx: No oropharyngeal exudate.  Eyes:     Pupils: Pupils are equal, round, and reactive to light.  Neck:     Thyroid: No thyromegaly.     Vascular: No JVD.     Trachea: No tracheal deviation.  Cardiovascular:     Rate and Rhythm: Normal rate and regular rhythm.     Heart sounds: Normal heart sounds. No murmur heard.   No friction rub. No gallop.  Pulmonary:     Effort: Pulmonary effort is normal. No respiratory distress.     Breath sounds: No wheezing or rales.  Chest:     Chest wall: No tenderness.  Abdominal:     General: Bowel sounds are normal.     Palpations: Abdomen is soft.     Tenderness: There is no abdominal tenderness.  Genitourinary:    Penis: Normal.      Testes: Normal.     Comments: No  rash or color changes. No swelling or TTP. No masses palpated. Musculoskeletal:        General: Normal range of motion.     Cervical back: Normal range of motion and neck supple.  Lymphadenopathy:     Cervical: No cervical adenopathy.  Skin:    General: Skin is warm and dry.  Neurological:     Mental Status: He is alert and oriented to person, place, and time.     Cranial Nerves: No cranial nerve deficit.  Psychiatric:        Behavior: Behavior normal.        Thought Content: Thought content normal.        Judgment: Judgment normal.      Assessment/Plan: 1. Scrotal pain Pain is improving, but will go ahead and start on cipro in case of possible underlying infection as cause. Low risk and denies chance of STI therefore will treat with cipro and patient was advised to contact office or go straight to  urgent care if any changes or acute worsening. Cannot rule out potential concern for hernia though no evidence of this on exam. May need to see urology if not improving.  2. Dysuria - POCT Urinalysis Dipstick  3. Elevated BP without diagnosis of hypertension Likely due to lack of sleep and anxiety over acute concerns today. Will monitor at home.   General Counseling: stefen jaquay understanding of the findings of todays visit and agrees with plan of treatment. I have discussed any further diagnostic evaluation that may be needed or ordered today. We also reviewed his medications today. he has been encouraged to call the office with any questions or concerns that should arise related to todays visit.    Counseling:    Orders Placed This Encounter  Procedures   POCT Urinalysis Dipstick    Meds ordered this encounter  Medications   ciprofloxacin (CIPRO) 500 MG tablet    Sig: Take 1 tablet (500 mg total) by mouth 2 (two) times daily for 10 days.    Dispense:  20 tablet    Refill:  0    Time spent:30 Minutes

## 2021-09-15 NOTE — Patient Instructions (Signed)
Testicular Self-Exam A self-examination of your testicles (testicular self-exam) involves looking at and feeling your testicles for abnormal lumps or swelling. Several things can cause swelling, lumps, or pain in your testicles. Some of these causes are: Injuries. Inflammation. Infection. Buildup of fluids around the testicle (hydrocele). Twisted testicles (testicular torsion). Testicular cancer. You may be at risk for testicular cancer if you have: An undescended testicle (cryptorchidism). A history of previous testicular cancer. A family history of testicular cancer. General tips and recommendations The testicles are easiest to examine after a warm bath or shower. They are more difficult to examine when you are cold because the muscles attached to the testicles retract and pull them up higher or into the abdomen. A normal testicle is egg-shaped and feels firm. It is smooth and not tender. It is normal to feel a firm, spaghetti-like cord at the back of your testicle. This is the spermatic cord. How to do a testicular self-exam  Stand and hold your penis away from your body. Look at each testicle to check for changes in appearance, such as swelling or changes in size or shape. Roll each testicle between your thumb and forefinger, feeling the entire testicle. Feel for: Lumps. Swelling. Discomfort. Check the groin area between your abdomen and upper thighs on both sides of your body. Look and feel for any swelling or bumps that are tender. These could be enlarged lymph nodes. Contact a health care provider if: You find any bumps or lumps, such as a small, hard, pea-sized lump. You find swelling, pain, or soreness. You see or feel any other changes in your testicles. Summary A self-examination of your testicles (testicular self-exam) involves looking at and feeling your testicles for any changes. Check each of your testicles for lumps, swelling, or discomfort. These changes can be caused  by many things. Check for swelling or tender bumps in your groin area between your lower abdomen and upper thighs. This information is not intended to replace advice given to you by your health care provider. Make sure you discuss any questions you have with your health care provider. Document Revised: 08/21/2019 Document Reviewed: 08/21/2019 Elsevier Patient Education  2022 Elsevier Inc.  

## 2021-10-06 ENCOUNTER — Other Ambulatory Visit: Payer: Self-pay

## 2021-10-06 ENCOUNTER — Ambulatory Visit (INDEPENDENT_AMBULATORY_CARE_PROVIDER_SITE_OTHER): Payer: 59 | Admitting: Physician Assistant

## 2021-10-06 ENCOUNTER — Encounter: Payer: Self-pay | Admitting: Physician Assistant

## 2021-10-06 DIAGNOSIS — R030 Elevated blood-pressure reading, without diagnosis of hypertension: Secondary | ICD-10-CM

## 2021-10-06 DIAGNOSIS — B353 Tinea pedis: Secondary | ICD-10-CM

## 2021-10-06 NOTE — Progress Notes (Signed)
[facility] [address] 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  -He has been letting soles of shoes dry recently and changing socks TID which has helped a lot and is starting to improve. Sin is not longer raw. -he is also going to avoid wearing shoes without socks and clean shoes regularly  - He has been using OTC tinactin cream which helps some, state she also has a spray that he does before putting on work boots -Discussed continuing this these changes and therapy, may consider switching to lotrimin or lamisil if it is not working as well.  -Further discussed if topicals do not work, we can consider oral therapy, but states he thinks what he is doing is working much better now and will continue this for now  Current Medication: No outpatient encounter medications on file as of 10/06/2021.   No facility-administered encounter medications on file as of 10/06/2021.    Surgical History: Past Surgical History:  Procedure Laterality Date   COLONOSCOPY  2016   TONSILLECTOMY     TYMPANOSTOMY TUBE PLACEMENT      Medical History: Past Medical History:  Diagnosis Date   Asthma     Family History: Family History  Problem Relation Age of Onset   Hypertension Mother    Hypertension Father    Diabetes Father     Social History   Socioeconomic History   Marital status: Single    Spouse name: Not on file   Number  of children: Not on file   Years of education: Not on file   Highest education level: Not on file  Occupational History   Not on file  Tobacco Use   Smoking status: Never   Smokeless tobacco: Never  Vaping Use   Vaping Use: Never used  Substance and Sexual Activity   Alcohol use: Yes    Comment: ocassionally    Drug use: No   Sexual activity: Not on file  Other Topics Concern   Not on file  Social History Narrative   Not on file   Social Determinants of Health   Financial Resource Strain: Not on file  Food Insecurity: Not on file  Transportation Needs: Not on file  Physical Activity: Not on file  Stress: Not on file  Social Connections: Not on file  Intimate Partner Violence: Not on file      Review of Systems  Constitutional:  Negative for chills, fatigue and unexpected weight change.  HENT:  Negative for congestion, rhinorrhea, sneezing and sore throat.   Eyes:  Negative for redness.  Respiratory:  Negative for cough, chest tightness and shortness of breath.   Cardiovascular:  Negative for chest pain and palpitations.  Gastrointestinal:  Negative for abdominal pain, constipation, diarrhea, nausea and vomiting.  Genitourinary:  Negative for dysuria and frequency.  Musculoskeletal:  Negative for arthralgias, back pain, joint swelling and neck pain.  Skin:  Positive for rash.       Athlete's foot   Neurological: Negative.  Negative for tremors and numbness.  Hematological:  Negative for adenopathy. Does not bruise/bleed easily.  Psychiatric/Behavioral:  Negative for behavioral problems (Depression), sleep disturbance and suicidal ideas. The patient is not nervous/anxious.    Vital Signs: BP (!) 142/84    Pulse 77    Temp 98.7 F (37.1 C)    Resp 16    Ht 5\' 8"  (1.727 m)    Wt 213 lb (96.6 kg)    SpO2 99%    BMI 32.39 kg/m    Physical Exam Vitals and nursing note reviewed.  Constitutional:      General: He is not in acute distress.    Appearance: He is  well-developed. He is obese. He is not diaphoretic.  HENT:     Head: Normocephalic and atraumatic.     Mouth/Throat:     Pharynx: No oropharyngeal exudate.  Eyes:     Pupils: Pupils are equal, round, and reactive to light.  Neck:     Thyroid: No thyromegaly.     Vascular: No JVD.     Trachea: No tracheal deviation.  Cardiovascular:     Rate and Rhythm: Normal rate and regular rhythm.     Heart sounds: Normal heart sounds. No murmur heard.   No friction rub. No gallop.  Pulmonary:     Effort: Pulmonary effort is normal. No respiratory distress.     Breath sounds: No wheezing or rales.  Chest:     Chest wall: No tenderness.  Abdominal:     General: Bowel sounds are normal.     Palpations: Abdomen is soft.  Musculoskeletal:        General: Normal range of motion.     Cervical back: Normal range of motion and neck supple.  Lymphadenopathy:     Cervical: No cervical adenopathy.  Skin:    General: Skin is warm and dry.     Findings: Erythema and rash present.     Comments: Healing skin on bottom of toes and in between digits consistent with fungal infection, worse on left pinky toe  Neurological:     Mental Status: He is alert and oriented to person, place, and time.     Cranial Nerves: No cranial nerve deficit.  Psychiatric:        Behavior: Behavior normal.        Thought Content: Thought content normal.        Judgment: Judgment normal.       Assessment/Plan: 1. Tinea pedis of both feet Will continue with frequent sock changes, drying of soles, cleaning shoes, and keeping feet dry. Will continue with OTC anti-fungal cream/spray and suggested trying lamisil or lotrimin if current brand not working as well. If acute worsening may consider oral therapy or podiatry referral if indicated  2. 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 Discussed we may need to start  medication if so.   General Counseling: joseph bias understanding of the findings of todays visit and agrees with plan of treatment. I have discussed any further diagnostic evaluation that may be needed or ordered today. We also reviewed his medications today. he has been encouraged to call the office with any questions or concerns that should arise related to todays visit.    No orders of the defined types were placed in this  encounter.   No orders of the defined types were placed in this encounter.   This patient was seen by Lynn ItoLauren Devonda Pequignot, PA-C in collaboration with Dr. Beverely RisenFozia Khan as a part of collaborative care agreement.   Total time spent:30 Minutes Time spent includes review of chart, medications, test results, and follow up plan with the patient.      Dr Lyndon CodeFozia M Khan Internal medicine

## 2021-10-07 NOTE — Patient Instructions (Signed)
Athlete's Foot °Athlete's foot (tinea pedis) is a fungal infection of the skin on your feet. It often occurs on the skin that is between or underneath your toes. It can also occur on the soles of your feet. Symptoms include itchy or white and flaky areas on the skin. The infection can spread from person to person (is contagious). It can also spread when a person's bare feet come in contact with the fungus on shower floors or on items such as shoes. °Follow these instructions at home: °Medicines °Apply or take over-the-counter and prescription medicines only as told by your doctor. °Apply your antifungal medicine as told by your doctor. Do not stop using it even if your feet start to get better. °Foot care °Do not scratch your feet. °Keep your feet dry: °Wear cotton or wool socks. Change your socks every day or if they become wet. °Wear shoes that allow air to move around, such as sandals or canvas tennis shoes. °Wash and dry your feet: °Every day or as told by your doctor. °After exercising. °Including the area between your toes. °General instructions °Do not share any of these items that touch your feet: °Towels. °Shoes. °Nail clippers. °Other personal items. °Protect your feet by wearing sandals in wet areas, such as locker rooms and shared showers. °Keep all follow-up visits. °If you have diabetes, keep your blood sugar under control. °Contact a doctor if: °You have a fever. °You have swelling, pain, warmth, or redness in your foot. °Your feet are not getting better with treatment. °Your symptoms get worse. °You have new symptoms. °You have very bad pain. °Summary °Athlete's foot is a fungal infection of the skin on your feet. °This condition is caused by a fungus that grows in warm, moist places. °Symptoms include itchy or white and flaky areas on the skin. °Apply your antifungal medicine as told by your doctor. °Keep your feet clean and dry. °This information is not intended to replace advice given to you by  your health care provider. Make sure you discuss any questions you have with your health care provider. °Document Revised: 01/05/2021 Document Reviewed: 01/05/2021 °Elsevier Patient Education © 2022 [employer] Inc. ° °

## 2021-11-10 ENCOUNTER — Encounter: Payer: Self-pay | Admitting: Physician Assistant

## 2021-12-05 ENCOUNTER — Ambulatory Visit: Payer: 59 | Admitting: Physician Assistant

## 2021-12-08 ENCOUNTER — Other Ambulatory Visit: Payer: Self-pay

## 2021-12-08 ENCOUNTER — Encounter: Payer: Self-pay | Admitting: Physician Assistant

## 2021-12-08 ENCOUNTER — Ambulatory Visit (INDEPENDENT_AMBULATORY_CARE_PROVIDER_SITE_OTHER): Payer: 59 | Admitting: Physician Assistant

## 2021-12-08 VITALS — BP 152/92 | HR 102 | Temp 97.6°F | Resp 16 | Ht 68.0 in | Wt 215.0 lb

## 2021-12-08 DIAGNOSIS — F411 Generalized anxiety disorder: Secondary | ICD-10-CM

## 2021-12-08 DIAGNOSIS — I10 Essential (primary) hypertension: Secondary | ICD-10-CM

## 2021-12-08 DIAGNOSIS — B353 Tinea pedis: Secondary | ICD-10-CM

## 2021-12-08 DIAGNOSIS — R69 Illness, unspecified: Secondary | ICD-10-CM | POA: Diagnosis not present

## 2021-12-08 MED ORDER — AMLODIPINE BESYLATE 5 MG PO TABS: 5.0000 mg | ORAL_TABLET | Freq: Every day | ORAL | 1 refills | Status: DC

## 2021-12-08 MED ORDER — ESCITALOPRAM OXALATE 5 MG PO TABS: 5.0000 mg | ORAL_TABLET | Freq: Every day | ORAL | 2 refills | Status: DC

## 2021-12-08 NOTE — Progress Notes (Cosign Needed)
[facility] [address] [place], [place] [address]  Internal MEDICINE  Office Visit Note  Patient Name: Jack Garza  [id number]  [id number]  Date of Service: 12/08/2021  Chief Complaint  Patient presents with   Follow-up   Asthma   Hypertension    Discuss BP - high today (163/100)    HPI Pt is here for routine follow-up to recheck blood pressure -Feet have cleared up with change to lamisil now -He has been monitoring his blood pressure at work and reports consistent readings 140s/90s.  He did have a few readings that were controlled in the 120s systolic but he has been worried about multiple elevated readings especially given reading in office today.   -He does report some headaches, but does not think that this is due to to blood pressure as he has checked his blood pressure during the headaches and BP has not been significantly elevated at those times.  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  Recently also reports some chest discomfort thinking about BP and thinks maybe anxiety. When he is working out or distracted he reports no problems with any shortness of breath or chest pain.  States the discomfort in his chest does not radiate and goes away with trying to relax and under an hour. -He is very anxious most days and this may be contributing to rising BP.  The past few years he feels like his anxiety has been rising.  He reports that his girlfriend battled cancer the past 2 years and he also lost his grandmother in the past year.  He states he has never been treated for anxiety however when he was diagnosed with Tourette's in elementary school he states they also diagnosed him with some sort of anxiety disorder but never was on treatment.  He is unsure exactly what they diagnosed. - He feels his anxiety is more generalized and not specific to certain social situations and  does not report any panic attack-like symptoms -He does notice his heart will race when he is feeling more anxious especially when he comes into the office however he normally does not feel like he is having palpitations or elevated heart rate during daily activities -He also mentions he has 2 small bumps on left forearm and one small bump on left leg only felt with palpation. Appears consistent with small cysts. Not painful or bothersome, may have [place] to evaluate in future.  Current Medication: Outpatient Encounter Medications as of 12/08/2021  Medication Sig   amLODipine (NORVASC) 5 MG tablet Take 1 tablet (5 mg total) by mouth daily.   escitalopram (LEXAPRO) 5 MG tablet Take 1 tablet (5 mg total) by mouth daily.   No facility-administered encounter medications on file as of 12/08/2021.    Surgical History: Past Surgical History:  Procedure Laterality Date   COLONOSCOPY  2016   TONSILLECTOMY     TYMPANOSTOMY TUBE PLACEMENT      Medical History: Past Medical History:  Diagnosis Date   Asthma     Family History: Family History  Problem Relation Age of Onset   Hypertension Mother    Hypertension Father    Diabetes Father     Social History   Socioeconomic History   Marital status: Single    Spouse name: Not on file   Number of children: Not on file   Years of  education: Not on file   Highest education level: Not on file  Occupational History   Not on file  Tobacco Use   Smoking status: Never   Smokeless tobacco: Never  Vaping Use   Vaping Use: Never used  Substance and Sexual Activity   Alcohol use: Yes    Comment: ocassionally    Drug use: No   Sexual activity: Not on file  Other Topics Concern   Not on file  Social History Narrative   Not on file   Social Determinants of Health   Financial Resource Strain: Not on file  Food Insecurity: Not on file  Transportation Needs: Not on file  Physical Activity: Not on file  Stress: Not on file  Social  Connections: Not on file  Intimate Partner Violence: Not on file      Review of Systems  Constitutional:  Negative for chills, fatigue and unexpected weight change.  HENT:  Negative for congestion, postnasal drip, rhinorrhea, sneezing and sore throat.   Eyes:  Negative for redness.  Respiratory:  Negative for cough, chest tightness and shortness of breath.   Cardiovascular:  Negative for chest pain and palpitations.  Gastrointestinal:  Negative for abdominal pain, constipation, diarrhea, nausea and vomiting.  Genitourinary:  Negative for dysuria and frequency.  Musculoskeletal:  Negative for arthralgias, back pain, joint swelling and neck pain.  Skin:  Negative for rash.       2 small bumps on left forearm and one small bump on left leg beneath the surface  Neurological: Negative.  Negative for tremors and numbness.  Hematological:  Negative for adenopathy. Does not bruise/bleed easily.  Psychiatric/Behavioral:  Negative for behavioral problems (Depression), sleep disturbance and suicidal ideas. The patient is nervous/anxious.    Vital Signs: BP (!) 152/92 Comment: 163/100   Pulse (!) 102    Temp 97.6 F (36.4 C)    Resp 16    Ht  (1.727 m)    Wt 215 lb (97.5 kg)    SpO2 97%    BMI 32.69 kg/m    Physical Exam Vitals and nursing note reviewed.  Constitutional:      General: He is not in acute distress.    Appearance: He is well-developed. He is obese. He is not diaphoretic.  HENT:     Head: Normocephalic and atraumatic.     Mouth/Throat:     Pharynx: No oropharyngeal exudate.  Eyes:     Pupils: Pupils are equal, round, and reactive to light.  Neck:     Thyroid: No thyromegaly.     Vascular: No JVD.     Trachea: No tracheal deviation.  Cardiovascular:     Rate and Rhythm: Regular rhythm. Tachycardia present.     Heart sounds: Normal heart sounds. No murmur heard.   No friction rub. No gallop.  Pulmonary:     Effort: Pulmonary effort is normal. No respiratory  distress.     Breath sounds: No wheezing or rales.  Chest:     Chest wall: No tenderness.  Abdominal:     General: Bowel sounds are normal.     Palpations: Abdomen is soft.  Musculoskeletal:        General: Normal range of motion.     Cervical back: Normal range of motion and neck supple.  Lymphadenopathy:     Cervical: No cervical adenopathy.  Skin:    General: Skin is warm and dry.     Comments: 2 small bumps along left forearm only noticeable with  palpation.  Nontender to palpation and no external skin changes.  Additional small bump on anterior left upper leg again felt with palpation and nontender  Neurological:     Mental Status: He is alert and oriented to person, place, and time.     Cranial Nerves: No cranial nerve deficit.  Psychiatric:        Behavior: Behavior normal.        Thought Content: Thought content normal.        Judgment: Judgment normal.       Assessment/Plan: 1. Primary hypertension Start on amlodipine and continue to monitor blood pressure closely.  May be able to decrease in future if elevated blood pressure resolves with management of anxiety. Will titrate up though if needed.  Patient will also monitor heart rate and will consider low-dose beta-blocker if continued elevation, however likely elevated currently due to anxiety in office. Follow up in 4weeks - amLODipine (NORVASC) 5 MG tablet; Take 1 tablet (5 mg total) by mouth daily.  Dispense: 90 tablet; Refill: 1  2. GAD (generalized anxiety disorder) Start on 5 mg Lexapro to help with generalized anxiety which may also help improve blood pressure readings.  May need to titrate up in future. Follow up in 4 weeks - escitalopram (LEXAPRO) 5 MG tablet; Take 1 tablet (5 mg total) by mouth daily.  Dispense: 30 tablet; Refill: 2  3. Tinea pedis of both feet Resolved with use of topical Lamisil   General Counseling: Garlin verbalizes understanding of the findings of todays visit and agrees with plan of  treatment. I have discussed any further diagnostic evaluation that may be needed or ordered today. We also reviewed his medications today. he has been encouraged to call the office with any questions or concerns that should arise related to todays visit.    No orders of the defined types were placed in this encounter.   Meds ordered this encounter  Medications   escitalopram (LEXAPRO) 5 MG tablet    Sig: Take 1 tablet (5 mg total) by mouth daily.    Dispense:  30 tablet    Refill:  2   amLODipine (NORVASC) 5 MG tablet    Sig: Take 1 tablet (5 mg total) by mouth daily.    Dispense:  90 tablet    Refill:  1    This patient was seen by Lynn Ito, PA-C in collaboration with Dr. Beverely Risen as a part of collaborative care agreement.   Total time spent:30 Minutes Time spent includes review of chart, medications, test results, and follow up plan with the patient.      Dr Lyndon Code Internal medicine

## 2021-12-12 ENCOUNTER — Encounter: Payer: Self-pay | Admitting: Physician Assistant

## 2021-12-15 ENCOUNTER — Encounter: Payer: Self-pay | Admitting: Physician Assistant

## 2022-01-05 ENCOUNTER — Ambulatory Visit (INDEPENDENT_AMBULATORY_CARE_PROVIDER_SITE_OTHER): Payer: 59 | Admitting: Physician Assistant

## 2022-01-05 ENCOUNTER — Encounter: Payer: Self-pay | Admitting: Physician Assistant

## 2022-01-05 VITALS — BP 133/85 | HR 82 | Temp 97.7°F | Resp 16 | Ht 68.0 in | Wt 205.4 lb

## 2022-01-05 DIAGNOSIS — R69 Illness, unspecified: Secondary | ICD-10-CM | POA: Diagnosis not present

## 2022-01-05 DIAGNOSIS — F411 Generalized anxiety disorder: Secondary | ICD-10-CM

## 2022-01-05 DIAGNOSIS — I10 Essential (primary) hypertension: Secondary | ICD-10-CM | POA: Diagnosis not present

## 2022-01-05 MED ORDER — AMLODIPINE BESYLATE 10 MG PO TABS: 10.0000 mg | ORAL_TABLET | Freq: Every day | ORAL | 1 refills | Status: DC

## 2022-01-05 MED ORDER — ESCITALOPRAM OXALATE 10 MG PO TABS
10.0000 mg | ORAL_TABLET | Freq: Every day | ORAL | 2 refills | Status: DC
Start: 2022-01-05 — End: 2022-04-22

## 2022-01-05 NOTE — Progress Notes (Signed)
[facility] ?[address] ?[place], [place] [address] ? ?Internal MEDICINE  ?Office Visit Note ? ?Patient Name: Jack Garza ? [id number]  ?[id number] ? ?Date of Service: 01/05/2022 ? ?Chief Complaint  ?Patient presents with  ? Follow-up  ? ? ?HPI ?Pt is for routine follow up for HTN and anxiety ?-Bp is doing better, has been 120s systolic this past week. He has been taking a total of 10mg  amlodipine (2 tablets of his 5mg  tabs). Will send higher dose since working better ?-Anxiety is also improving, but does still have some anxiety and is interested in increasing Lexapro. Will increase to 10mg  and see how he responds to this. ?-has his dept Physical next month and will have labs done at this time ? ?Current Medication: ?Outpatient Encounter Medications as of 01/05/2022  ?Medication Sig  ? amLODipine (NORVASC) 10 MG tablet Take 1 tablet (10 mg total) by mouth daily.  ? escitalopram (LEXAPRO) 10 MG tablet Take 1 tablet (10 mg total) by mouth daily.  ? [DISCONTINUED] amLODipine (NORVASC) 5 MG tablet Take 1 tablet (5 mg total) by mouth daily.  ? [DISCONTINUED] escitalopram (LEXAPRO) 5 MG tablet Take 1 tablet (5 mg total) by mouth daily.  ? ?No facility-administered encounter medications on file as of 01/05/2022.  ? ? ?Surgical History: ?Past Surgical History:  ?Procedure Laterality Date  ? COLONOSCOPY  2016  ? TONSILLECTOMY    ? TYMPANOSTOMY TUBE PLACEMENT    ? ? ?Medical History: ?Past Medical History:  ?Diagnosis Date  ? Asthma   ? ? ?Family History: ?Family History  ?Problem Relation Age of Onset  ? Hypertension Mother   ? Hypertension Father   ? Diabetes Father   ? ? ?Social History  ? ?Socioeconomic History  ? Marital status: Single  ?  Spouse name: Not on file  ? Number of children: Not on file  ? Years of education: Not on file  ? Highest education level: Not on file  ?Occupational History  ? Not on file  ?Tobacco Use  ? Smoking status: Never  ? Smokeless tobacco: Never  ?Vaping Use  ?  Vaping Use: Never used  ?Substance and Sexual Activity  ? Alcohol use: Yes  ?  Comment: ocassionally   ? Drug use: No  ? Sexual activity: Not on file  ?Other Topics Concern  ? Not on file  ?Social History Narrative  ? Not on file  ? ?Social Determinants of Health  ? ?Financial Resource Strain: Not on file  ?Food Insecurity: Not on file  ?Transportation Needs: Not on file  ?Physical Activity: Not on file  ?Stress: Not on file  ?Social Connections: Not on file  ?Intimate Partner Violence: Not on file  ? ? ? ? ?Review of Systems  ?Constitutional:  Negative for chills, fatigue and unexpected weight change.  ?HENT:  Negative for congestion, rhinorrhea, sneezing and sore throat.   ?Eyes:  Negative for redness.  ?Respiratory:  Negative for cough, chest tightness and shortness of breath.   ?Cardiovascular:  Negative for chest pain and palpitations.  ?Gastrointestinal:  Negative for abdominal pain, constipation, diarrhea, nausea and vomiting.  ?Genitourinary:  Negative for dysuria and frequency.  ?Musculoskeletal:  Negative for arthralgias, back pain, joint swelling and neck pain.  ?Skin:  Negative for rash.  ?Neurological: Negative.  Negative for tremors and numbness.  ?Hematological:  Negative for adenopathy. Does not bruise/bleed easily.  ?Psychiatric/Behavioral:  Negative for behavioral problems (Depression), sleep disturbance and suicidal ideas. The patient is nervous/anxious.   ? ?  Vital Signs: ?BP 133/85   Pulse 82   Temp 97.7 ?F (36.5 ?C)   Resp 16   Ht 5\' 8"  (1.727 m)   Wt 205 lb 6.4 oz (93.2 kg)   SpO2 98%   BMI 31.23 kg/m?  ? ? ?Physical Exam ?Vitals and nursing note reviewed.  ?Constitutional:   ?   General: He is not in acute distress. ?   Appearance: He is well-developed. He is obese. He is not diaphoretic.  ?HENT:  ?   Head: Normocephalic and atraumatic.  ?   Mouth/Throat:  ?   Pharynx: No oropharyngeal exudate.  ?Eyes:  ?   Pupils: Pupils are equal, round, and reactive to light.  ?Neck:  ?   Thyroid: No  thyromegaly.  ?   Vascular: No JVD.  ?   Trachea: No tracheal deviation.  ?Cardiovascular:  ?   Rate and Rhythm: Normal rate and regular rhythm.  ?   Heart sounds: Normal heart sounds. No murmur heard. ?  No friction rub. No gallop.  ?Pulmonary:  ?   Effort: Pulmonary effort is normal. No respiratory distress.  ?   Breath sounds: No wheezing or rales.  ?Chest:  ?   Chest wall: No tenderness.  ?Abdominal:  ?   General: Bowel sounds are normal.  ?   Palpations: Abdomen is soft.  ?Musculoskeletal:     ?   General: Normal range of motion.  ?   Cervical back: Normal range of motion and neck supple.  ?Lymphadenopathy:  ?   Cervical: No cervical adenopathy.  ?Skin: ?   General: Skin is warm and dry.  ?Neurological:  ?   Mental Status: He is alert and oriented to person, place, and time.  ?   Cranial Nerves: No cranial nerve deficit.  ?Psychiatric:     ?   Behavior: Behavior normal.     ?   Thought Content: Thought content normal.     ?   Judgment: Judgment normal.  ? ? ? ? ? ?Assessment/Plan: ?1. Primary hypertension ?Will continue 10mg  amlodipine and send script for this dose. Will continue to monitor ?- amLODipine (NORVASC) 10 MG tablet; Take 1 tablet (10 mg total) by mouth daily.  Dispense: 90 tablet; Refill: 1 ? ?2. GAD (generalized anxiety disorder) ?Improving, but still having some anxiety. Will increase to 10mg  Lexapro ?- escitalopram (LEXAPRO) 10 MG tablet; Take 1 tablet (10 mg total) by mouth daily.  Dispense: 30 tablet; Refill: 2 ? ? ?General Counseling: Jack Garza verbalizes understanding of the findings of todays visit and agrees with plan of treatment. I have discussed any further diagnostic evaluation that may be needed or ordered today. We also reviewed his medications today. he has been encouraged to call the office with any questions or concerns that should arise related to todays visit. ? ? ? ?No orders of the defined types were placed in this encounter. ? ? ?Meds ordered this encounter  ?Medications  ?  escitalopram (LEXAPRO) 10 MG tablet  ?  Sig: Take 1 tablet (10 mg total) by mouth daily.  ?  Dispense:  30 tablet  ?  Refill:  2  ? amLODipine (NORVASC) 10 MG tablet  ?  Sig: Take 1 tablet (10 mg total) by mouth daily.  ?  Dispense:  90 tablet  ?  Refill:  1  ? ? ?This patient was seen by Lynn ItoLauren Cyann Venti, PA-C in collaboration with Dr. Beverely RisenFozia Khan as a part of collaborative care agreement. ? ? ?Total time  spent:30 Minutes ?Time spent includes review of chart, medications, test results, and follow up plan with the patient.  ? ? ? ? ?Dr Lyndon Code ?Internal medicine  ?

## 2022-02-06 ENCOUNTER — Ambulatory Visit: Payer: Self-pay

## 2022-02-06 DIAGNOSIS — Z0000 Encounter for general adult medical examination without abnormal findings: Secondary | ICD-10-CM

## 2022-02-06 LAB — POCT URINALYSIS DIPSTICK
Bilirubin, UA: NEGATIVE
Blood, UA: NEGATIVE
Glucose, UA: NEGATIVE
Ketones, UA: NEGATIVE
Leukocytes, UA: NEGATIVE
Nitrite, UA: NEGATIVE
Protein, UA: NEGATIVE
Spec Grav, UA: >=1.03 — AB (ref 1.010–1.025)
Urobilinogen, UA: 0.2 E.U./dL
pH, UA: 6 (ref 5.0–8.0)

## 2022-02-07 LAB — CMP12+LP+TP+TSH+6AC+CBC/D/PLT
ALT: 27 IU/L (ref 0–44)
AST: 15 IU/L (ref 0–40)
Albumin/Globulin Ratio: 2.2 (ref 1.2–2.2)
Albumin: 4.6 g/dL (ref 4.1–5.2)
Alkaline Phosphatase: 42 IU/L — ABNORMAL LOW (ref 44–121)
BUN/Creatinine Ratio: 19 (ref 9–20)
BUN: 18 mg/dL (ref 6–20)
Basophils Absolute: 0 10*3/uL (ref 0.0–0.2)
Basos: 1 %
Bilirubin Total: 0.6 mg/dL (ref 0.0–1.2)
Calcium: 9.3 mg/dL (ref 8.7–10.2)
Chloride: 105 mmol/L (ref 96–106)
Chol/HDL Ratio: 5 ratio (ref 0.0–5.0)
Cholesterol, Total: 174 mg/dL (ref 100–199)
Creatinine, Ser: 0.93 mg/dL (ref 0.76–1.27)
EOS (ABSOLUTE): 0.1 10*3/uL (ref 0.0–0.4)
Eos: 2 %
Estimated CHD Risk: 1 times avg. (ref 0.0–1.0)
Free Thyroxine Index: 2 (ref 1.2–4.9)
GGT: 10 IU/L (ref 0–65)
Globulin, Total: 2.1 g/dL (ref 1.5–4.5)
Glucose: 95 mg/dL (ref 70–99)
HDL: 35 mg/dL — ABNORMAL LOW (ref >39)
Hematocrit: 45.4 % (ref 37.5–51.0)
Hemoglobin: 15.2 g/dL (ref 13.0–17.7)
Immature Grans (Abs): 0 10*3/uL (ref 0.0–0.1)
Immature Granulocytes: 0 %
Iron: 84 ug/dL (ref 38–169)
LDH: 136 IU/L (ref 121–224)
LDL Chol Calc (NIH): 119 mg/dL — ABNORMAL HIGH (ref 0–99)
Lymphocytes Absolute: 1.7 10*3/uL (ref 0.7–3.1)
Lymphs: 38 %
MCH: 30.1 pg (ref 26.6–33.0)
MCHC: 33.5 g/dL (ref 31.5–35.7)
MCV: 90 fL (ref 79–97)
Monocytes Absolute: 0.3 10*3/uL (ref 0.1–0.9)
Monocytes: 8 %
Neutrophils Absolute: 2.2 10*3/uL (ref 1.4–7.0)
Neutrophils: 51 %
Phosphorus: 3.3 mg/dL (ref 2.8–4.1)
Platelets: 237 10*3/uL (ref 150–450)
Potassium: 5 mmol/L (ref 3.5–5.2)
RBC: 5.05 x10E6/uL (ref 4.14–5.80)
RDW: 12.2 % (ref 11.6–15.4)
Sodium: 141 mmol/L (ref 134–144)
T3 Uptake Ratio: 27 % (ref 24–39)
T4, Total: 7.3 ug/dL (ref 4.5–12.0)
TSH: 1.11 u[IU]/mL (ref 0.450–4.500)
Total Protein: 6.7 g/dL (ref 6.0–8.5)
Triglycerides: 107 mg/dL (ref 0–149)
Uric Acid: 5.3 mg/dL (ref 3.8–8.4)
VLDL Cholesterol Cal: 20 mg/dL (ref 5–40)
WBC: 4.4 10*3/uL (ref 3.4–10.8)
eGFR: 118 mL/min/{1.73_m2} (ref >59)

## 2022-02-10 IMAGING — US US ABDOMEN LIMITED
1 series · 6 of 6 positions shown · non-contrast
Comparison: June 25, 2015.

CLINICAL DATA: Splenomegaly.

EXAM:
ULTRASOUND ABDOMEN LIMITED

[Series 1: general · 6 of 6 slices shown]
[im 1/6]
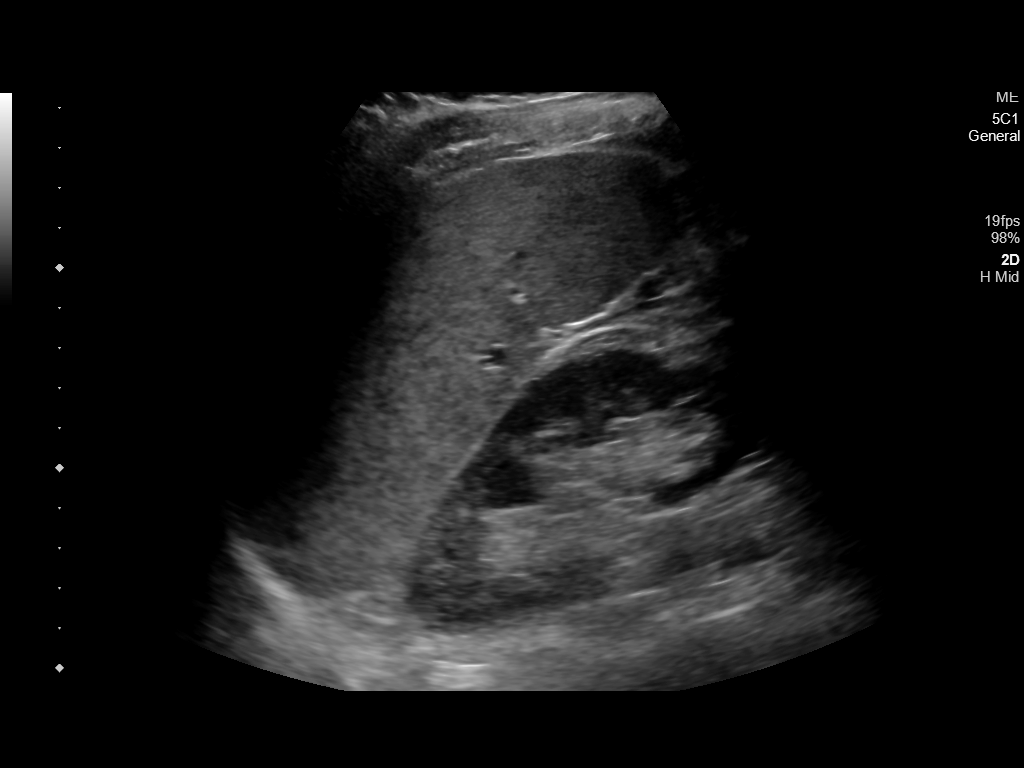
[im 2/6]
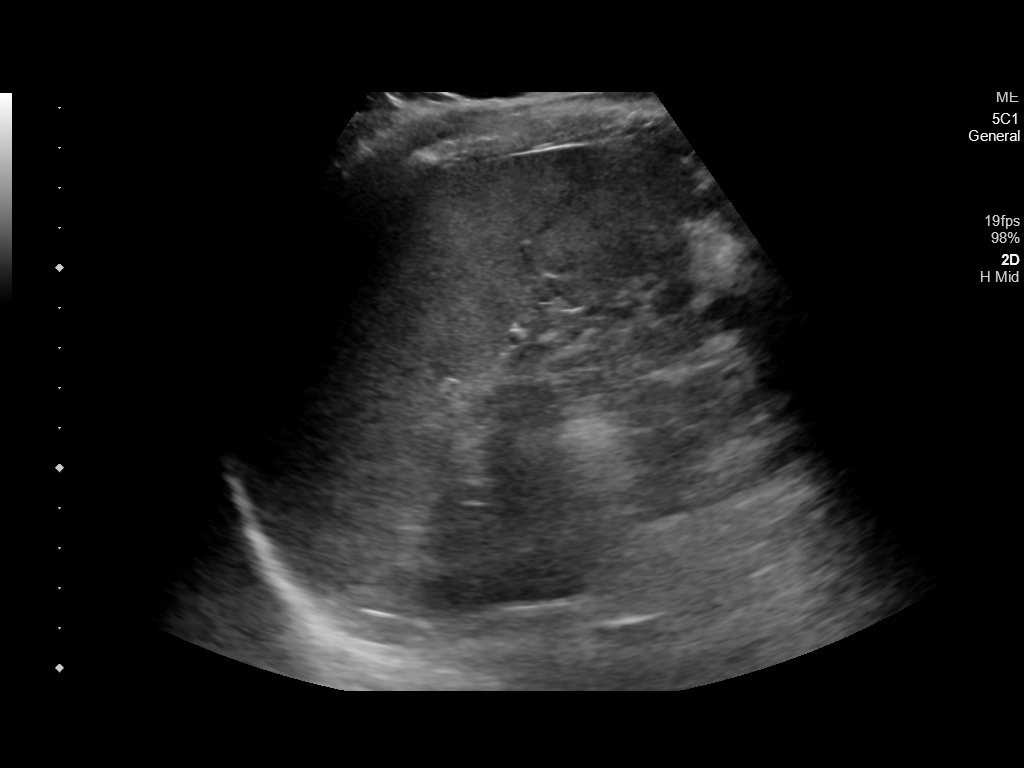
[im 3/6]
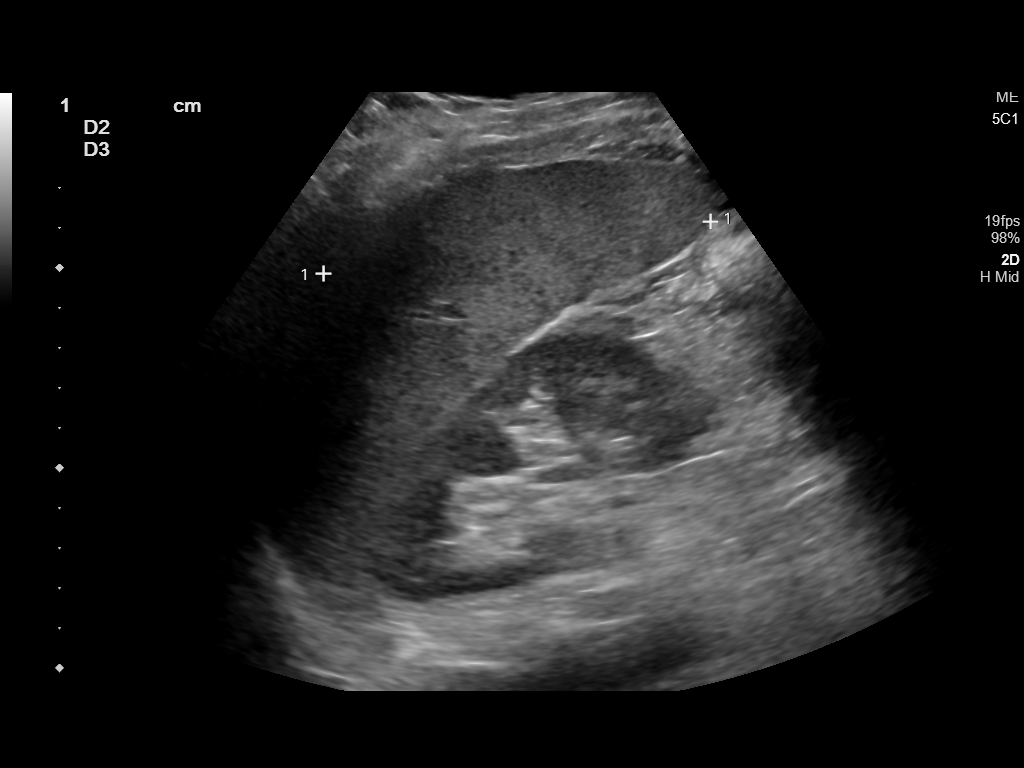
[im 4/6]
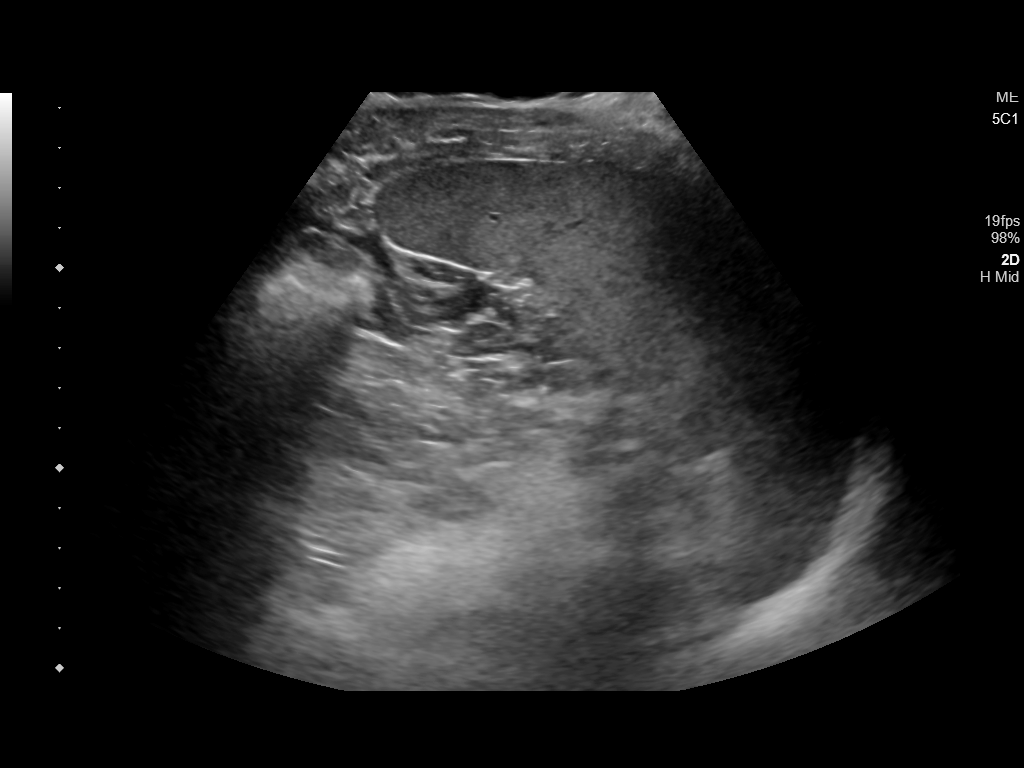
[im 5/6]
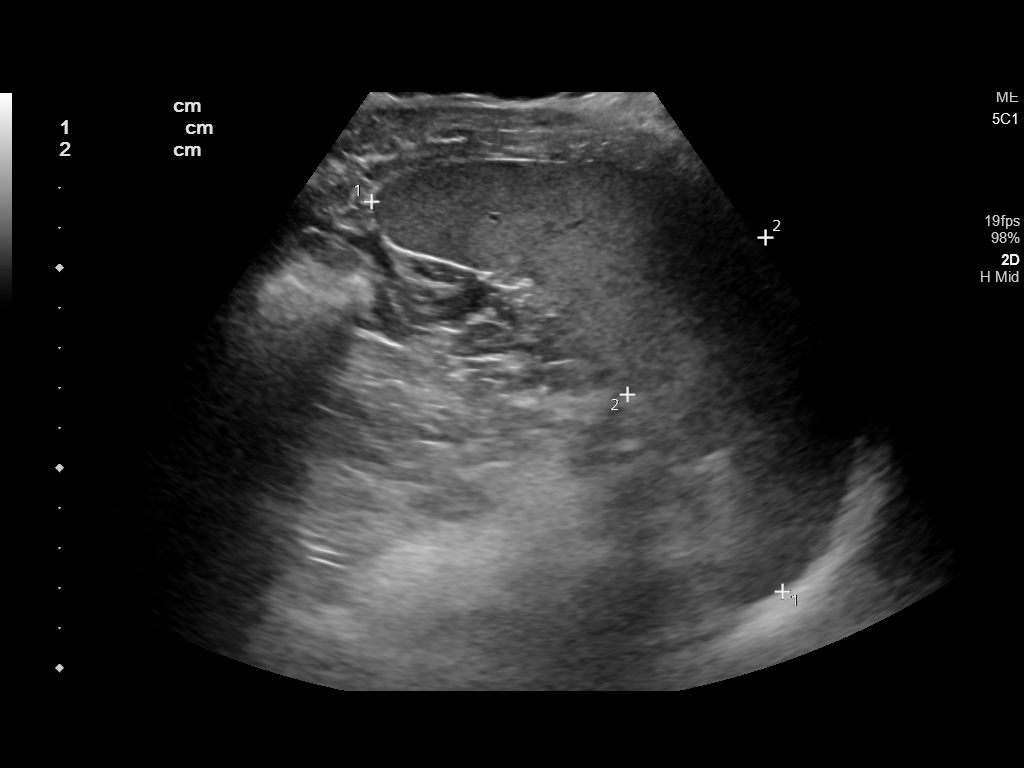
[im 6/6]
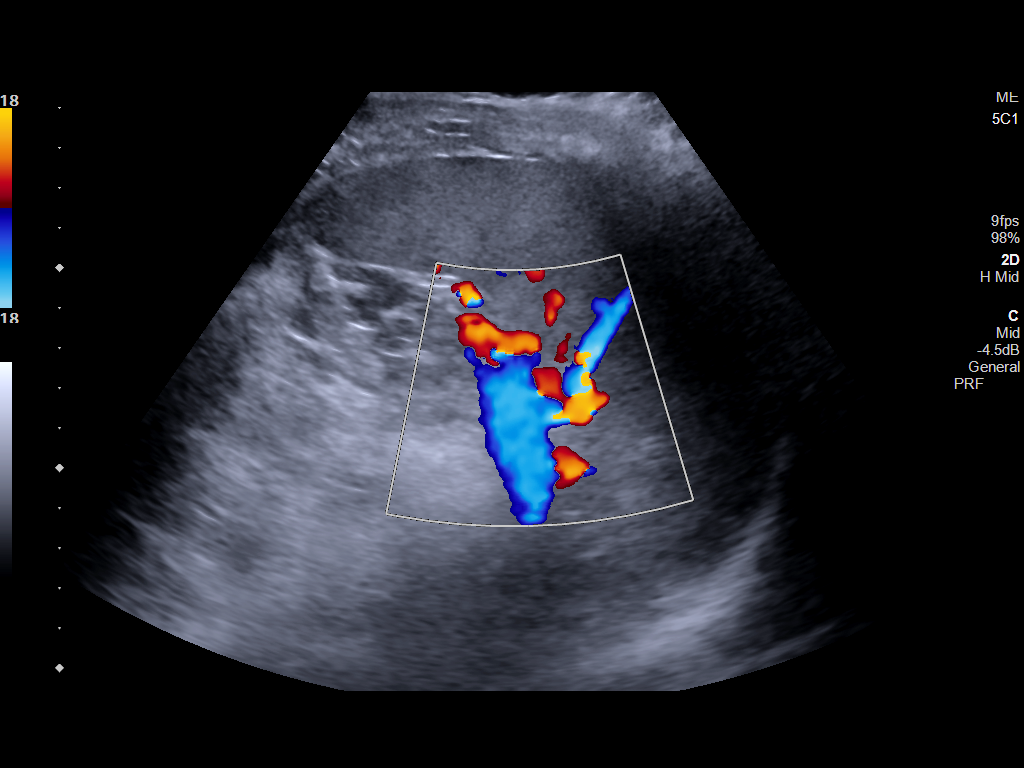

[6 of 6 positions shown; findings below may reference images not displayed]

FINDINGS: The spleen measures 14.1 x 9.8 x 5.2 cm with calculated volume of
378 cubic cm, which is within normal limits. No focal abnormality is
noted.
IMPRESSION: Spleen has calculated volume of 378 cubic cm which is within normal
limits.

## 2022-02-12 ENCOUNTER — Encounter: Payer: Self-pay | Admitting: Physician Assistant

## 2022-02-12 ENCOUNTER — Ambulatory Visit: Payer: Self-pay | Admitting: Physician Assistant

## 2022-02-12 VITALS — BP 126/80 | HR 73 | Temp 98.0°F | Resp 12 | Ht 67.0 in | Wt 187.0 lb

## 2022-02-12 DIAGNOSIS — I10 Essential (primary) hypertension: Secondary | ICD-10-CM

## 2022-02-12 DIAGNOSIS — F411 Generalized anxiety disorder: Secondary | ICD-10-CM

## 2022-02-12 DIAGNOSIS — Z0000 Encounter for general adult medical examination without abnormal findings: Secondary | ICD-10-CM

## 2022-02-12 NOTE — Progress Notes (Signed)
[facility]  ____________________________________________   None    (approximate)  I have reviewed the triage vital signs and the nursing notes.   HISTORY  Chief Complaint Annual Exam   HPI Jack Garza is a 26 y.o. 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  Patient voices no concerns or complaints.        Past Medical History:  Diagnosis Date   Asthma     Patient Active Problem List   Diagnosis Date Noted   Adverse food reaction 06/17/2020   Irritable bowel syndrome with diarrhea 01/16/2020   Examination, physical, employee 07/18/2019   Encounter for physical examination related to employment 07/18/2019   Chronic diarrhea 08/18/2015   Duodenitis 08/18/2015   Diarrhea 07/09/2015   Right upper quadrant abdominal pain 07/09/2015   Non-intractable vomiting with nausea 07/09/2015   Sprain of rhomboid 11/07/2013    Past Surgical History:  Procedure Laterality Date   COLONOSCOPY  2016   TONSILLECTOMY     TYMPANOSTOMY TUBE PLACEMENT      Prior to Admission medications   Medication Sig Start Date End Date Taking? Authorizing Provider  amLODipine (NORVASC) 10 MG tablet Take 1 tablet (10 mg total) by mouth daily. 01/05/22  Yes McDonough, Lauren K, PA-C  escitalopram (LEXAPRO) 10 MG tablet Take 1 tablet (10 mg total) by mouth daily. 01/05/22  Yes McDonough, Lauren K, PA-C    Allergies Gluten meal, Peanuts [peanut oil], Soy allergy, and Wheat bran  Family History  Problem Relation Age of Onset   Hypertension Mother    Hypertension Father    Diabetes Father     Social History Social History   Tobacco Use   Smoking status: Never   Smokeless tobacco: Never  Vaping Use   Vaping Use: Never used  Substance Use Topics   Alcohol use: Yes    Comment: ocassionally    Drug use: No    Review of Systems Constitutional: No fever/chills Eyes: No visual changes. ENT: No sore throat. Cardiovascular:  Denies chest pain. Respiratory: Denies shortness of breath. Gastrointestinal: No abdominal pain.  No nausea, no vomiting.  No diarrhea.  No constipation. Genitourinary: Negative for dysuria. Musculoskeletal: Negative for back pain. Skin: Negative for rash. Neurological: Negative for headaches, focal weakness or numbness. Psychiatric: Anxiety Endocrine: Hypertension Hematological/Lymphatic:  Allergic/Immunilogical: Gluten, peanuts, soy allergy, and wheat grain. ____________________________________________   PHYSICAL EXAM:  VITAL SIGNS: BP is 126/80, pulse 73, respiration 12, temperature 98, and patient 97% O2 sat on room air.  Patient weighs 187 pounds and BMI is 29.29. Constitutional: Alert and oriented. Well appearing and in no acute distress. Eyes: Conjunctivae are normal. PERRL. EOMI. Head: Atraumatic. Nose: No congestion/rhinnorhea. Mouth/Throat: Mucous membranes are moist.  Oropharynx non-erythematous. Neck: No stridor.  No cervical spine tenderness to palpation. Hematological/Lymphatic/Immunilogical: No cervical lymphadenopathy. Cardiovascular: Normal rate, regular rhythm. Grossly normal heart sounds.  Good peripheral circulation. Respiratory: Normal respiratory effort.  No retractions. Lungs CTAB. Gastrointestinal: Soft and nontender. No distention. No abdominal bruits. No CVA tenderness. Genitourinary: Deferred Musculoskeletal: No lower extremity tenderness nor edema.  No joint effusions. Neurologic:  Normal speech and language. No gross focal neurologic deficits are appreciated. No gait instability. Skin:  Skin is warm, dry and intact. No rash noted. Psychiatric: Mood and affect are normal. Speech and behavior are normal.  ____________________________________________   LABS         Component Ref Range & Units 6 d ago (02/06/22) 5 mo ago (09/15/21) 1 yr ago (  01/28/21) 2 yr ago (01/01/20) 2 yr ago (07/18/19)  Color, UA  Amber    Yellow  Yellow  Yellow   Clarity, UA   Clear    Clear  Clear  Cloudy   Glucose, UA Negative Negative  Negative  Negative  Negative  Negative   Bilirubin, UA  Negative  Negative  Negative  Negative  neg   Ketones, UA  Negative  Negative  Negative  Negative  neg   Spec Grav, UA 1.010 - 1.025 >=1.030 Abnormal   1.025  1.015  >=1.030 Abnormal   >=1.030 Abnormal    Blood, UA  Negative  Negative  Negative  Negative  neg   pH, UA 5.0 - 8.0 6.0  6.0  7.5  5.5  6.0   Protein, UA Negative Negative  Negative  Positive Abnormal  CM  Negative  Positive Abnormal  CM   Urobilinogen, UA 0.2 or 1.0 E.U./dL 0.2  0.2  0.2  0.2  0.2   Nitrite, UA  Negative  Negative  Negative  Negative  neg   Leukocytes, UA Negative Negative  Negative  Negative  Negative  Negative   Appearance            Odor                        View Encounter Conversation             Other Results from 02/06/2022   Contains abnormal data CMP12+LP+TP+TSH+6AC+CBC/D/Plt Order: [id number] Status: Final result    Visible to patient: Yes (seen)    Next appt: 03/09/2022 at 01:40 PM in Pulmonology Si Gaul McDonough, PA-C)    Dx: Routine adult health maintenance    0 Result Notes          Component Ref Range & Units 6 d ago (02/06/22) 1 yr ago (01/28/21) 2 yr ago (01/01/20) 2 yr ago (07/18/19) 6 yr ago (06/25/15) 6 yr ago (06/25/15)  Glucose 70 - 99 mg/dL 95  94 R  95 R  90 R  128 High  R    Uric Acid 3.8 - 8.4 mg/dL 5.3  7.1 CM  7.4 CM  7.2 R, CM     Comment:            Therapeutic target for gout patients: <6.0  BUN 6 - 20 mg/dL 18  15  15  19  19     Creatinine, Ser 0.76 - 1.27 mg/dL 0.93  0.85  0.91  0.91  1.00 R    eGFR >59 mL/min/1.73 118  125       BUN/Creatinine Ratio 9 - 20 19  18  16  21  High      Sodium 134 - 144 mmol/L 141  140  140  140  137 R    Potassium 3.5 - 5.2 mmol/L 5.0  4.9  4.5  4.5  4.1 R    Chloride 96 - 106 mmol/L 105  102  104  106  101 R    Calcium 8.7 - 10.2 mg/dL 9.3  9.4  9.7  9.5  9.6 R    Phosphorus 2.8 - 4.1 mg/dL 3.3  2.7 Low   3.3   3.7     Total Protein 6.0 - 8.5 g/dL 6.7  7.1  6.8  6.8  7.2 R    Albumin 4.1 - 5.2 g/dL 4.6  4.6  4.9  4.6  4.8 R    Globulin, Total  1.5 - 4.5 g/dL 2.1  2.5  1.9  2.2     Albumin/Globulin Ratio 1.2 - 2.2 2.2  1.8  2.6 High   2.1     Bilirubin Total 0.0 - 1.2 mg/dL 0.6  0.6  0.4  0.6  1.2 R    Alkaline Phosphatase 44 - 121 IU/L 42 Low   47  49 R  46 R  43 Low  R    LDH 121 - 224 IU/L 136  144  140  140     AST 0 - 40 IU/L 15  13  18  19  20  R    ALT 0 - 44 IU/L 27  29  27  31  13  Low  R    GGT 0 - 65 IU/L 10  21  19  21      Iron 38 - 169 ug/dL 84  108  66  106     Cholesterol, Total 100 - 199 mg/dL 174  210 High   181  198     Triglycerides 0 - 149 mg/dL 107  214 High   125  121     HDL >39 mg/dL 35 Low   36 Low   33 Low   38 Low      VLDL Cholesterol Cal 5 - 40 mg/dL 20  39  23  22     LDL Chol Calc (NIH) 0 - 99 mg/dL 119 High   135 High   125 High   138 High      Chol/HDL Ratio 0.0 - 5.0 ratio 5.0  5.8 High  CM  5.5 High  CM  5.2 High  CM     Comment:                                   T. Chol/HDL Ratio                                              Men  Women                                1/2 Avg.Risk  3.4    3.3                                    Avg.Risk  5.0    4.4                                 2X Avg.Risk  9.6    7.1                                 3X Avg.Risk 23.4   11.0   Estimated CHD Risk 0.0 - 1.0 times avg. 1.0  1.2 High  CM  1.2 High  CM  1.1 High  CM     Comment: The CHD Risk is based on the T. Chol/HDL ratio. Other  factors affect CHD Risk such as hypertension, smoking,  diabetes, severe obesity, and family history of  premature CHD.  TSH 0.450 - 4.500 uIU/mL 1.110  0.772  0.427 Low   1.260     T4, Total 4.5 - 12.0 ug/dL 7.3  8.1  8.0  8.5     T3 Uptake Ratio 24 - 39 % 27  26  31  28      Free Thyroxine Index 1.2 - 4.9 2.0  2.1  2.5  2.4     WBC 3.4 - 10.8 x10E3/uL 4.4  5.4  4.7  5.3   11.3 R   RBC 4.14 - 5.80 x10E6/uL 5.05  5.26  4.71  5.11   5.25 R   Hemoglobin  13.0 - 17.7 g/dL 15.2  15.7  14.9  15.2   16.4 High  R   Hematocrit 37.5 - 51.0 % 45.4  45.9  42.5  44.8   46.0 R   MCV 79 - 97 fL 90  87  90  88   87.6 R   MCH 26.6 - 33.0 pg 30.1  29.8  31.6  29.7   31.2 R   MCHC 31.5 - 35.7 g/dL 33.5  34.2  35.1  33.9   35.7 R   RDW 11.6 - 15.4 % 12.2  12.2  12.2  11.9   12.3 R   Platelets 150 - 450 x10E3/uL 237  261  238  254   202 R   Neutrophils Not Estab. % 51  50  48  53   88 R   Lymphs Not Estab. % 38  38  40  37     Monocytes Not Estab. % 8  8  9  8      Eos Not Estab. % 2  4  2  2      Basos Not Estab. % 1  0  1  0     Neutrophils Absolute 1.4 - 7.0 x10E3/uL 2.2  2.7  2.3  2.8   9.9 High  R   Lymphocytes Absolute 0.7 - 3.1 x10E3/uL 1.7  2.1  1.9  2.0   0.6 Low  R   Monocytes Absolute 0.1 - 0.9 x10E3/uL 0.3  0.4  0.4  0.4     EOS (ABSOLUTE) 0.0 - 0.4 x10E3/uL 0.1  0.2  0.1  0.1     Basophils Absolute 0.0 - 0.2 x10E3/uL 0.0  0.0  0.0  0.0   0.0 R   Immature Granulocytes Not Estab. % 0  0  0  0     Immature Grans             ____________________________________________  EKG  Sinus  Rhythm 63 bpm WITHIN NORMAL LIMITS  ____________________________________________     ____________________________________________   INITIAL IMPRESSION / ASSESSMENT AND PLAN   As part of my medical decision making, I reviewed the following data within the electronic MEDICAL RECORD NUMBER      Discussed lab and EKG findings with patient.        ____________________________________________   FINAL CLINICAL IMPRESSION Well exam  ED Discharge Orders     None        Note:  This document was prepared using Dragon voice recognition software and may include unintentional dictation errors.

## 2022-02-12 NOTE — Progress Notes (Signed)
Pt presents today to complete physical, Pt denies any issues or concerns at this time/CL,RMA 

## 2022-03-09 ENCOUNTER — Ambulatory Visit (INDEPENDENT_AMBULATORY_CARE_PROVIDER_SITE_OTHER): Payer: 59 | Admitting: Physician Assistant

## 2022-03-09 ENCOUNTER — Encounter: Payer: Self-pay | Admitting: Physician Assistant

## 2022-03-09 VITALS — BP 121/77 | HR 75 | Temp 98.6°F | Resp 16 | Ht 67.0 in | Wt 190.0 lb

## 2022-03-09 DIAGNOSIS — E782 Mixed hyperlipidemia: Secondary | ICD-10-CM

## 2022-03-09 DIAGNOSIS — I10 Essential (primary) hypertension: Secondary | ICD-10-CM | POA: Diagnosis not present

## 2022-03-09 DIAGNOSIS — R69 Illness, unspecified: Secondary | ICD-10-CM | POA: Diagnosis not present

## 2022-03-09 DIAGNOSIS — F411 Generalized anxiety disorder: Secondary | ICD-10-CM | POA: Diagnosis not present

## 2022-03-09 NOTE — Progress Notes (Signed)
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 His labs show elevated cholesterol, but was improved from last year and he is working on his diet and exercise  Current Medication: Outpatient Encounter Medications as of 03/09/2022  Medication Sig   amLODipine (NORVASC) 10 MG tablet Take 1 tablet (10 mg total) by mouth daily.   escitalopram (LEXAPRO) 10 MG tablet Take 1 tablet (10 mg total) by mouth daily.   No facility-administered encounter medications on file as of 03/09/2022.    Surgical History: Past Surgical History:  Procedure Laterality Date   COLONOSCOPY  2016   TONSILLECTOMY     TYMPANOSTOMY TUBE PLACEMENT      Medical History: Past Medical History:  Diagnosis Date   Asthma     Family History: Family History  Problem Relation Age of Onset   Hypertension Mother    Hypertension Father    Diabetes Father     Social History   Socioeconomic History   Marital status: Single    Spouse name: Not on file   Number of children: Not on file   Years of education: Not on file   Highest education level: Not on file  Occupational History   Not on file  Tobacco Use   Smoking status: Never   Smokeless tobacco: Never  Vaping Use   Vaping Use: Never used  Substance and Sexual Activity   Alcohol use: Yes    Comment: ocassionally    Drug use: No   Sexual activity: Not on file  Other Topics Concern   Not on file  Social History Narrative   Not on file   Social Determinants of Health   Financial Resource  Strain: Not on file  Food Insecurity: Not on file  Transportation Needs: Not on file  Physical Activity: Not on file  Stress: Not on file  Social Connections: Not on file  Intimate Partner Violence: Not on file      Review of Systems  Constitutional:  Negative for chills, fatigue and unexpected weight change.  HENT:  Negative for congestion, rhinorrhea, sneezing and sore throat.   Eyes:  Negative for redness.  Respiratory:  Negative for cough, chest tightness and shortness of breath.   Cardiovascular:  Negative for chest pain and palpitations.  Gastrointestinal:  Negative for abdominal pain, constipation, diarrhea, nausea and vomiting.  Genitourinary:  Negative for dysuria and frequency.  Musculoskeletal:  Negative for arthralgias, back pain, joint swelling and neck pain.  Skin:  Negative for rash.  Neurological: Negative.  Negative for tremors and numbness.  Hematological:  Negative for adenopathy. Does not bruise/bleed easily.  Psychiatric/Behavioral:  Negative for behavioral problems (Depression), sleep disturbance and suicidal ideas. The patient is not nervous/anxious.     Vital Signs: BP 121/77   Pulse 75   Temp 98.6 F (37 C)   Resp 16   Ht 5\' 7"  (1.702 m)   Wt 190 lb (86.2 kg)   SpO2 98%   BMI 29.76 kg/m    Physical Exam Vitals and  nursing note reviewed.  Constitutional:      General: He is not in acute distress.    Appearance: He is well-developed. He is obese. He is not diaphoretic.  HENT:     Head: Normocephalic and atraumatic.     Mouth/Throat:     Pharynx: No oropharyngeal exudate.  Eyes:     Pupils: Pupils are equal, round, and reactive to light.  Neck:     Thyroid: No thyromegaly.     Vascular: No JVD.     Trachea: No tracheal deviation.  Cardiovascular:     Rate and Rhythm: Normal rate and regular rhythm.     Heart sounds: Normal heart sounds. No murmur heard.    No friction rub. No gallop.  Pulmonary:     Effort: Pulmonary effort is normal.  No respiratory distress.     Breath sounds: No wheezing or rales.  Chest:     Chest wall: No tenderness.  Abdominal:     General: Bowel sounds are normal.     Palpations: Abdomen is soft.  Musculoskeletal:        General: Normal range of motion.     Cervical back: Normal range of motion and neck supple.  Lymphadenopathy:     Cervical: No cervical adenopathy.  Skin:    General: Skin is warm and dry.  Neurological:     Mental Status: He is alert and oriented to person, place, and time.     Cranial Nerves: No cranial nerve deficit.  Psychiatric:        Behavior: Behavior normal.        Thought Content: Thought content normal.        Judgment: Judgment normal.        Assessment/Plan: 1. Primary hypertension Well controlled, continue amlodipine  2. GAD (generalized anxiety disorder) Well controlled, continue Lexapro  3. 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 I have discussed any further diagnostic evaluation that may be needed or ordered today. We also reviewed his medications today. he has been encouraged to call the office with any questions or concerns that should arise related to todays visit.    No orders of the defined types were placed in this encounter.   No orders of the defined types were placed in this encounter.   This patient was seen by Lynn Ito, PA-C in collaboration with Dr. Beverely Risen as a part of collaborative care agreement.   Total time spent:30 Minutes Time spent includes review of chart, medications, test results, and follow up plan with the patient.      Dr Lyndon Code Internal medicine

## 2022-03-23 ENCOUNTER — Ambulatory Visit
Admission: RE | Admit: 2022-03-23 | Discharge: 2022-03-23 | Disposition: A | Discharge status: Home / Self Care | Payer: No Typology Code available for payment source | Source: Ambulatory Visit | Attending: Nurse Practitioner | Admitting: Nurse Practitioner

## 2022-03-23 ENCOUNTER — Other Ambulatory Visit: Payer: Self-pay | Admitting: Nurse Practitioner

## 2022-03-23 DIAGNOSIS — Z021 Encounter for pre-employment examination: Secondary | ICD-10-CM

## 2022-04-21 ENCOUNTER — Other Ambulatory Visit: Payer: Self-pay | Admitting: Physician Assistant

## 2022-04-21 DIAGNOSIS — F411 Generalized anxiety disorder: Secondary | ICD-10-CM

## 2022-07-09 ENCOUNTER — Ambulatory Visit: Payer: Self-pay | Admitting: Physician Assistant

## 2022-08-06 ENCOUNTER — Ambulatory Visit: Payer: Self-pay | Admitting: Physician Assistant

## 2022-11-29 ENCOUNTER — Other Ambulatory Visit: Payer: Self-pay

## 2022-11-29 ENCOUNTER — Emergency Department (HOSPITAL_BASED_OUTPATIENT_CLINIC_OR_DEPARTMENT_OTHER)
Admission: EM | Admit: 2022-11-29 | Discharge: 2022-11-29 | Disposition: A | Discharge status: Home / Self Care | Payer: No Typology Code available for payment source | Attending: Emergency Medicine | Admitting: Emergency Medicine

## 2022-11-29 ENCOUNTER — Emergency Department (HOSPITAL_BASED_OUTPATIENT_CLINIC_OR_DEPARTMENT_OTHER): Payer: No Typology Code available for payment source | Admitting: Radiology

## 2022-11-29 DIAGNOSIS — Y990 Civilian activity done for income or pay: Secondary | ICD-10-CM | POA: Insufficient documentation

## 2022-11-29 DIAGNOSIS — W208XXA Other cause of strike by thrown, projected or falling object, initial encounter: Secondary | ICD-10-CM | POA: Insufficient documentation

## 2022-11-29 DIAGNOSIS — J45909 Unspecified asthma, uncomplicated: Secondary | ICD-10-CM | POA: Diagnosis not present

## 2022-11-29 DIAGNOSIS — Z91010 Allergy to peanuts: Secondary | ICD-10-CM | POA: Insufficient documentation

## 2022-11-29 DIAGNOSIS — S9031XA Contusion of right foot, initial encounter: Secondary | ICD-10-CM | POA: Diagnosis not present

## 2022-11-29 DIAGNOSIS — M79671 Pain in right foot: Secondary | ICD-10-CM | POA: Diagnosis present

## 2022-11-29 NOTE — ED Provider Notes (Signed)
[facility] Provider Note   CSN: [id number] Arrival date & time: 11/29/22  1156     History  Chief Complaint  Patient presents with   Foot Injury    Jack Garza is a 27 y.o. male.  With history of asthma who presents to the ED for evaluation of right foot pain.  He was at work when he dropped a bed on his foot.  This occurred approximately 2 hours prior to arrival.  States he noticed some pain and swelling immediately afterwards.  He has been able to ambulate on the foot since the incident.  States he took some ibuprofen for pain.  Currently rates his pain as a 3 out of 10.  Denies numbness, weakness or tingling.  Also reports an abrasion overlying the area.  Last tetanus was within the last 5 years.  Denies other injuries.  Foot Injury      Home Medications Prior to Admission medications   Medication Sig Start Date End Date Taking? Authorizing Provider  amLODipine (NORVASC) 10 MG tablet Take 1 tablet (10 mg total) by mouth daily. 01/05/22   McDonough, Lauren K, PA-C  escitalopram (LEXAPRO) 10 MG tablet TAKE 1 TABLET(10 MG) BY MOUTH DAILY 04/22/22   McDonough, Si Gaul, PA-C      Allergies    Gluten meal, Peanuts [peanut oil], Soy allergy, and Wheat bran    Review of Systems   Review of Systems  Musculoskeletal:  Positive for arthralgias.  All other systems reviewed and are negative.   Physical Exam Updated Vital Signs BP (!) 150/81 (BP Location: Right Arm)   Pulse 71   Temp 98 F (36.7 C) (Oral)   Resp 16   SpO2 100%  Physical Exam Vitals and nursing note reviewed.  Constitutional:      General: He is not in acute distress.    Appearance: Normal appearance. He is normal weight. He is not ill-appearing.  HENT:     Head: Normocephalic and atraumatic.  Pulmonary:     Effort: Pulmonary effort is normal. No respiratory distress.  Abdominal:     General: Abdomen is flat.  Musculoskeletal:        General:  Swelling (Minimal swelling, dorsal aspect of the right foot) and tenderness present. No deformity. Normal range of motion.     Cervical back: Neck supple.  Skin:    General: Skin is warm and dry.     Capillary Refill: Capillary refill takes less than 2 seconds.     Comments: 2 cm x 2 cm abrasion on the dorsum of the right foot overlying the second and third metatarsals  Neurological:     Mental Status: He is alert and oriented to person, place, and time.     Comments: Sensation intact in bilateral feet.  DP pulses 2+ bilaterally.  Cap refill normal bilaterally.  He is able to wiggle his toes and has full range of motion of the ankle  Psychiatric:        Mood and Affect: Mood normal.        Behavior: Behavior normal.     ED Results / Procedures / Treatments   Labs (all labs ordered are listed, but only abnormal results are displayed) Labs Reviewed - No data to display  EKG None  Radiology DG Foot Complete Right  Result Date: 11/29/2022 CLINICAL DATA:  Dropped Murphy bed onto right foot today at work. Abrasion to third through fourth metatarsal bones. EXAM: RIGHT FOOT  COMPLETE - 3+ VIEW COMPARISON:  None Available. FINDINGS: Dorsal soft tissue swelling overlying the metatarsal bones. No underlying fracture or dislocation. No significant arthropathy identified. IMPRESSION: Dorsal soft tissue swelling. No acute bone abnormality. Electronically Signed   By: Kerby Moors M.D.   On: 11/29/2022 13:03    Procedures Procedures    Medications Ordered in ED Medications - No data to display  ED Course/ Medical Decision Making/ A&P                             Medical Decision Making Amount and/or Complexity of Data Reviewed Radiology: ordered.  This patient presents to the ED for concern of right foot pain, this involves an extensive number of treatment options.  The differential diagnosis includes fracture, strain, sprain, contusion  My initial workup includes x-ray right  foot  Additional history obtained from: Nursing notes from this visit  I ordered imaging studies including x-ray right foot I independently visualized and interpreted imaging which showed swelling.  No osseous abnormalities I agree with the radiologist interpretation  Afebrile, hemodynamically stable.  27 year old male presents ED for evaluation of right foot pain after the bed fell on her.  X-ray negative.  Neurovascular status intact.  Patient was placed in postop shoe for comfort.  He was educated on appropriate dosing of Tylenol and ibuprofen to help with pain.  He was given work note per patient request.  He was given return precautions.  He was encouraged to follow-up with his primary care provider in 1 to 2 weeks for reevaluation if his symptoms do not improve.  Stable at discharge.  At this time there does not appear to be any evidence of an acute emergency medical condition and the patient appears stable for discharge with appropriate outpatient follow up. Diagnosis was discussed with patient who verbalizes understanding of care plan and is agreeable to discharge. I have discussed return precautions with patient who verbalizes understanding. Patient encouraged to follow-up with their PCP within 1 week. All questions answered.  Note: Portions of this report may have been transcribed using voice recognition software. Every effort was made to ensure accuracy; however, inadvertent computerized transcription errors may still be present.        Final Clinical Impression(s) / ED Diagnoses Final diagnoses:  Contusion of right foot, initial encounter    Rx / DC Orders ED Discharge Orders     None         Sudeys, Pruner, Hershal Coria 11/29/22 1404    Regan Lemming, MD 11/29/22 1454

## 2022-11-29 NOTE — ED Triage Notes (Signed)
Patient dropped murphy bed (bed that comes out of wall) on right foot. Has been icing. Swelling and bruising noted to the top of right foot. CMS intact. Ambulatory.

## 2022-11-29 NOTE — Discharge Instructions (Signed)
You have been seen today for your complaint of right foot pain. Your imaging was reassuring and showed no abnormalities. Your discharge medications include Alternate tylenol and ibuprofen for pain. You may alternate these every 4 hours. You may take up to 800 mg of ibuprofen at a time and up to 1000 mg of tylenol. Follow up with: Your primary care provider in 1 to 2 weeks if not improving Please seek immediate medical care if you develop any of the following symptoms: You have severe pain. Your foot or toes become numb, and the numbness does not go away when you remove the wrap, move, and elevate your foot. Your foot or toes become pale or cold. You cannot move your foot or ankle. At this time there does not appear to be the presence of an emergent medical condition, however there is always the potential for conditions to change. Please read and follow the below instructions.  Do not take your medicine if  develop an itchy rash, swelling in your mouth or lips, or difficulty breathing; call 911 and seek immediate emergency medical attention if this occurs.  You may review your lab tests and imaging results in their entirety on your MyChart account.  Please discuss all results of fully with your primary care provider and other specialist at your follow-up visit.  Note: Portions of this text may have been transcribed using voice recognition software. Every effort was made to ensure accuracy; however, inadvertent computerized transcription errors may still be present.

## 2023-01-21 ENCOUNTER — Ambulatory Visit (INDEPENDENT_AMBULATORY_CARE_PROVIDER_SITE_OTHER): Payer: 59 | Admitting: Sports Medicine

## 2023-01-21 ENCOUNTER — Ambulatory Visit (INDEPENDENT_AMBULATORY_CARE_PROVIDER_SITE_OTHER): Payer: 59

## 2023-01-21 DIAGNOSIS — R2242 Localized swelling, mass and lump, left lower limb: Secondary | ICD-10-CM

## 2023-01-21 DIAGNOSIS — D179 Benign lipomatous neoplasm, unspecified: Secondary | ICD-10-CM | POA: Diagnosis not present

## 2023-01-21 NOTE — Assessment & Plan Note (Signed)
As below multiple lipomas, he does have 1 anterior thigh subcutaneous, that does feel somewhat hard and nonmovable. I would like to ultrasound this, if this is not clearly a benign lipoma we will consider MRI +/- excisional biopsy. 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

## 2023-01-21 NOTE — Assessment & Plan Note (Signed)
27 year old male, for his entire life he has noted some subcutaneous masses, approximately 1 to 2 cm across, well-circumscribed, movable and nontender. For the most part they have not changed. I do note the same on exam, predominantly on the left side. They feel like benign lipomas.  We will watch these for now.

## 2023-01-21 NOTE — Progress Notes (Signed)
    Procedures performed today:    None.  Independent interpretation of notes and tests performed by another provider:   None.  Brief History, Exam, Impression, and Recommendations:    Multiple lipomas 27 year old male, for his entire life he has noted some subcutaneous masses, approximately 1 to 2 cm across, well-circumscribed, movable and nontender. For the most part they have not changed. I do note the same on exam, predominantly on the left side. They feel like benign lipomas.  We will watch these for now.  Mass of left thigh As below multiple lipomas, he does have 1 anterior thigh subcutaneous, that does feel somewhat hard and nonmovable. I would like to ultrasound this, if this is not clearly a benign lipoma we will consider MRI +/- excisional biopsy. 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    ____________________________________________ Jack Garza. Jack Garza, M.D., ABFM., CAQSM., AME. Primary Care and Sports Medicine [facility]  Adjunct Professor of Family Medicine  [employer]  Restaurant manager, fast food

## 2023-06-22 ENCOUNTER — Ambulatory Visit: Payer: 59 | Admitting: Sports Medicine

## 2023-06-22 DIAGNOSIS — G8929 Other chronic pain: Secondary | ICD-10-CM

## 2023-06-22 DIAGNOSIS — M25571 Pain in right ankle and joints of right foot: Secondary | ICD-10-CM | POA: Diagnosis not present

## 2023-06-22 NOTE — Progress Notes (Signed)
    Procedures performed today:    None.  Independent interpretation of notes and tests performed by another provider:   None.  Brief History, Exam, Impression, and Recommendations:    Chronic pain of right ankle Very pleasant 27 year old male, he has had a 28-month history of pain right ankle near the sinus tarsi, worse with exercise, walking and running. He has had an extensive workup at [facility], he has had multiple x-rays this year, he has had greater than 6 weeks of physical therapy, medications all without improvement. On exam there are really no discrete areas of tenderness to palpation, he does have pes cavus, good motion, good strength, I suspect this is sinus tarsi syndrome though some of his discomfort is a bit distal and more around the cuboid. We will proceed with MRI of the ankle to rule out stress injury, I would like him to get some custom molded orthotics at the [facility] sports medicine center, home physical therapy given, return to see me in about 6 weeks.    ____________________________________________ Ihor Austin. Benjamin Stain, M.D., ABFM., CAQSM., AME. Primary Care and Sports Medicine [facility]  Adjunct Professor of Family Medicine  [employer]  Restaurant manager, fast food

## 2023-06-22 NOTE — Assessment & Plan Note (Signed)
Very pleasant 27 year old male, he has had a 28-month history of pain right ankle near the sinus tarsi, worse with exercise, walking and running. He has had an extensive workup at [facility], he has had multiple x-rays this year, he has had greater than 6 weeks of physical therapy, medications all without improvement. On exam there are really no discrete areas of tenderness to palpation, he does have pes cavus, good motion, good strength, I suspect this is sinus tarsi syndrome though some of his discomfort is a bit distal and more around the cuboid. We will proceed with MRI of the ankle to rule out stress injury, I would like him to get some custom molded orthotics at the [facility] sports medicine center, home physical therapy given, return to see me in about 6 weeks.

## 2023-06-26 ENCOUNTER — Ambulatory Visit (INDEPENDENT_AMBULATORY_CARE_PROVIDER_SITE_OTHER): Payer: 59

## 2023-06-26 DIAGNOSIS — G8929 Other chronic pain: Secondary | ICD-10-CM | POA: Diagnosis not present

## 2023-06-26 DIAGNOSIS — M25571 Pain in right ankle and joints of right foot: Secondary | ICD-10-CM | POA: Diagnosis not present

## 2023-07-07 ENCOUNTER — Encounter: Payer: 59 | Admitting: Family Medicine

## 2023-07-14 ENCOUNTER — Encounter: Payer: Self-pay | Admitting: Family Medicine

## 2023-07-14 ENCOUNTER — Ambulatory Visit: Payer: 59 | Admitting: Family Medicine

## 2023-07-14 ENCOUNTER — Other Ambulatory Visit: Payer: Self-pay

## 2023-07-14 VITALS — BP 124/84 | Ht 67.0 in | Wt 207.0 lb

## 2023-07-14 DIAGNOSIS — M79671 Pain in right foot: Secondary | ICD-10-CM | POA: Diagnosis not present

## 2023-07-14 DIAGNOSIS — Q6671 Congenital pes cavus, right foot: Secondary | ICD-10-CM | POA: Diagnosis not present

## 2023-07-14 DIAGNOSIS — Q6672 Congenital pes cavus, left foot: Secondary | ICD-10-CM | POA: Insufficient documentation

## 2023-07-14 NOTE — Assessment & Plan Note (Addendum)
Patient was fitted for a standard, cushioned, semi-rigid orthotic.  The orthotic was heated and the patient stood on the orthotic blank positioned on the orthotic stand. The patient was positioned in subtalar neutral position and 10 degrees of ankle dorsiflexion in a weight bearing stance. After molding, a stable [employer] EVA base was applied to the orthotic blank.   The blank was ground to a stable position for weight bearing. size: 11 base: Blue EVA  posting: none additional orthotic padding: none Orthotics fit well and patients gait: neutral with reported stability  We wills ee how Jack Garza's feet feel during exercise with the particular motions that aggravate his sxs. Hopefully this will give him some relief.  I don't believe he needs a metatarsal cookie at this point as his splaying and transverse arch changes are minimal  If any adjustments are needed he can return

## 2023-07-14 NOTE — Progress Notes (Signed)
  Jack Garza - 27 y.o. male MRN [id number]  Date of birth: June 04, 1996  PCP: Carlean Jews, PA-C  Subjective:  No chief complaint on file.  R ankle pain and bilateral foot pain  HPI: Past Medical, Surgical, Social, and Family History Reviewed & Updated per EMR.   Patient is a 27 y.o. male here for pain located in his R ankle and bilateral feet, only when running and especially uphill. He was seen for this previously and MRI was unremarkable. Previously seen by Dr. Benjamin Stain and sxs suspected to be sinus tarsi syndrome. He has not had any change in his sxs since his last visit on 06/22/2023.  Past Surgical History:  Procedure Laterality Date   COLONOSCOPY  2016   TONSILLECTOMY     TYMPANOSTOMY TUBE PLACEMENT      Allergies  Allergen Reactions   Gluten Meal Nausea And Vomiting   Peanuts [Peanut Oil]     Peanut allergy showed up on allergy testing per father.  Patient has eaten peanuts and has not had a reaction that parents are aware of.   Soy Allergy Nausea And Vomiting   Wheat Diarrhea        Objective:  Physical Exam: VS: BP:124/84  HR: bpm  TEMP: ( )  RESP:   HT:5\' 7"  (170.2 cm)   WT:207 lb (93.9 kg)  BMI:32.41  Gen: NAD, speaks clearly, comfortable in exam room Respiratory: normal work of breathing on room air Skin: No rashes, abrasions, or ecchymosis MSK:  Bilateral Ankles: No visible erythema or swelling. Achilles and calcaneus neutral bilat On standing there is slight pronation at the forefoot with moderate pes cavus  Splaying of digits 1-2 bilaterally w/ slight internal rotation of the 5th digit bilat Very mild breakdown of the transverse arch bilat Range of motion is full in all directions and resistance does not elicit pain. No sensory deficits Dorsalis pedis pulse equal bilat   Assessment & Plan:   Pes cavus of both feet Patient was fitted for a standard, cushioned, semi-rigid orthotic.  The orthotic was heated and the patient stood  on the orthotic blank positioned on the orthotic stand. The patient was positioned in subtalar neutral position and 10 degrees of ankle dorsiflexion in a weight bearing stance. After molding, a stable [employer] EVA base was applied to the orthotic blank.   The blank was ground to a stable position for weight bearing. size: 11 base: Blue EVA  posting: none additional orthotic padding: none Orthotics fit well and patients gait: neutral with reported stability  We wills ee how Jack Garza's feet feel during exercise with the particular motions that aggravate his sxs. Hopefully this will give him some relief.  I don't believe he needs a metatarsal cookie at this point as his splaying and transverse arch changes are minimal  If any adjustments are needed he can return     Jack Mote MD [facility] Sports Medicine Fellow  Addendum:  Patient seen in the office by fellow.  History, exam, plan of care were precepted with me.  Patient examined after orthotics preparation - orthotics fit well and had neutral alignment.  Darene Lamer, DO, CAQSM

## 2023-08-03 ENCOUNTER — Ambulatory Visit: Payer: 59 | Admitting: Sports Medicine

## 2023-08-10 ENCOUNTER — Ambulatory Visit: Payer: 59 | Admitting: Sports Medicine

## 2023-08-10 DIAGNOSIS — G8929 Other chronic pain: Secondary | ICD-10-CM | POA: Diagnosis not present

## 2023-08-10 DIAGNOSIS — M25571 Pain in right ankle and joints of right foot: Secondary | ICD-10-CM | POA: Diagnosis not present

## 2023-08-10 NOTE — Assessment & Plan Note (Signed)
This is a very pleasant 27 year old male with a 7-month history of pain right ankle near the sinus tarsi, worse with exercise, walking and running, he did have an extensive workup at [facility] with x-rays, physical therapy, medications without sufficient improvement. On exam there were no discrete areas of tenderness to palpation but he does have some pes cavus otherwise good motion and good strength. Discomfort was a bit distal to the sinus tarsi and around the cuboid, we ultimately obtained an MRI that was entirely negative with the exception of some edema at deltoid ligament medially, he had pain medially. He did get some custom molded orthotics and has responded wonderfully, he has no pain, he is happy with how things are going, he can return to see me as needed.

## 2023-08-10 NOTE — Progress Notes (Signed)
    Procedures performed today:    None.  Independent interpretation of notes and tests performed by another provider:   None.  Brief History, Exam, Impression, and Recommendations:    Chronic pain of right ankle This is a very pleasant 27 year old male with a 46-month history of pain right ankle near the sinus tarsi, worse with exercise, walking and running, he did have an extensive workup at [facility] with x-rays, physical therapy, medications without sufficient improvement. On exam there were no discrete areas of tenderness to palpation but he does have some pes cavus otherwise good motion and good strength. Discomfort was a bit distal to the sinus tarsi and around the cuboid, we ultimately obtained an MRI that was entirely negative with the exception of some edema at deltoid ligament medially, he had pain medially. He did get some custom molded orthotics and has responded wonderfully, he has no pain, he is happy with how things are going, he can return to see me as needed.    ____________________________________________ Ihor Austin. Benjamin Stain, M.D., ABFM., CAQSM., AME. Primary Care and Sports Medicine [facility]  Adjunct Professor of Family Medicine  [employer]  Restaurant manager, fast food

## 2024-01-21 ENCOUNTER — Ambulatory Visit: Admitting: Sports Medicine

## 2024-01-21 VITALS — BP 143/85 | HR 68 | Wt 214.0 lb

## 2024-01-21 DIAGNOSIS — L0292 Furuncle, unspecified: Secondary | ICD-10-CM | POA: Diagnosis not present

## 2024-01-21 DIAGNOSIS — F39 Unspecified mood [affective] disorder: Secondary | ICD-10-CM | POA: Insufficient documentation

## 2024-01-21 MED ORDER — DOXYCYCLINE HYCLATE 100 MG PO TABS: 100.0000 mg | ORAL_TABLET | Freq: Two times a day (BID) | ORAL | 0 refills | Status: AC

## 2024-01-21 NOTE — Assessment & Plan Note (Signed)
 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 We discussed initial antibiotic treatment with warm compresses, drying therapy such as wearing cotton underwear, potentially using antiperspirant in the inguinal creases. He understands that follow-up does need to be with his primary care provider for all medical issues.

## 2024-01-21 NOTE — Progress Notes (Signed)
    Procedures performed today:    None.  Independent interpretation of notes and tests performed by another provider:   None.  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 We discussed initial antibiotic treatment with warm compresses, drying therapy such as wearing cotton underwear, potentially using antiperspirant in the inguinal creases. He understands that follow-up does need to be with his primary care provider for all medical issues.  Mood disorder (HCC) [name] did bring up anxiety, he has used an SSRI in the past which was really effective, I have encouraged him to talk to his primary care provider about restarting.    ____________________________________________ Joselyn Nicely. Sandy Crumb, M.D., ABFM., CAQSM., AME. Primary Care and Sports Medicine [facility]  Adjunct Professor of [facility]  [employer]  Restaurant manager, fast food

## 2024-01-21 NOTE — Assessment & Plan Note (Signed)
 Jack Garza did bring up anxiety, he has used an SSRI in the past which was really effective, I have encouraged him to talk to his primary care provider about restarting.

## 2024-05-30 ENCOUNTER — Encounter: Payer: Self-pay | Admitting: Sports Medicine
